# Patient Record
Sex: Male | Born: 1975 | Race: White | Hispanic: No | Marital: Married | Smoking: Never smoker
Health system: Southern US, Community
[De-identification: ages and names within clinical notes are randomized; demographics above are authoritative.]

## PROBLEM LIST (undated history)

## (undated) DIAGNOSIS — J45909 Unspecified asthma, uncomplicated: Secondary | ICD-10-CM

## (undated) DIAGNOSIS — R569 Unspecified convulsions: Secondary | ICD-10-CM

## (undated) DIAGNOSIS — F419 Anxiety disorder, unspecified: Secondary | ICD-10-CM

## (undated) DIAGNOSIS — E7800 Pure hypercholesterolemia, unspecified: Secondary | ICD-10-CM

## (undated) DIAGNOSIS — R12 Heartburn: Secondary | ICD-10-CM

## (undated) DIAGNOSIS — I10 Essential (primary) hypertension: Secondary | ICD-10-CM

## (undated) HISTORY — DX: Pure hypercholesterolemia, unspecified: E78.00

## (undated) HISTORY — PX: TYMPANOSTOMY TUBE PLACEMENT: SHX32

## (undated) HISTORY — DX: Essential (primary) hypertension: I10

## (undated) HISTORY — DX: Unspecified convulsions: R56.9

## (undated) HISTORY — DX: Anxiety disorder, unspecified: F41.9

## (undated) HISTORY — DX: Unspecified asthma, uncomplicated: J45.909

## (undated) HISTORY — DX: Heartburn: R12

---

## 2001-09-01 ENCOUNTER — Encounter: Payer: Self-pay | Admitting: Urology

## 2001-09-01 ENCOUNTER — Ambulatory Visit (HOSPITAL_COMMUNITY): Admission: RE | Admit: 2001-09-01 | Discharge: 2001-09-01 | Payer: Self-pay | Admitting: Urology

## 2007-10-19 ENCOUNTER — Ambulatory Visit (HOSPITAL_COMMUNITY): Admission: RE | Admit: 2007-10-19 | Discharge: 2007-10-19 | Payer: Self-pay | Admitting: Otolaryngology

## 2007-12-08 ENCOUNTER — Ambulatory Visit (HOSPITAL_COMMUNITY): Admission: RE | Admit: 2007-12-08 | Discharge: 2007-12-08 | Payer: Self-pay | Admitting: Otolaryngology

## 2008-07-02 ENCOUNTER — Ambulatory Visit: Payer: Self-pay | Admitting: Orthopedic Surgery

## 2008-07-02 DIAGNOSIS — S838X9A Sprain of other specified parts of unspecified knee, initial encounter: Secondary | ICD-10-CM | POA: Insufficient documentation

## 2008-07-02 DIAGNOSIS — S86819A Strain of other muscle(s) and tendon(s) at lower leg level, unspecified leg, initial encounter: Secondary | ICD-10-CM

## 2008-12-03 ENCOUNTER — Ambulatory Visit (HOSPITAL_COMMUNITY): Admission: RE | Admit: 2008-12-03 | Discharge: 2008-12-03 | Payer: Self-pay | Admitting: Family Medicine

## 2013-01-10 ENCOUNTER — Ambulatory Visit (INDEPENDENT_AMBULATORY_CARE_PROVIDER_SITE_OTHER): Payer: No Typology Code available for payment source | Admitting: Family Medicine

## 2013-01-10 ENCOUNTER — Encounter: Payer: Self-pay | Admitting: Family Medicine

## 2013-01-10 VITALS — BP 138/86 | Temp 98.5°F | Wt 272.8 lb

## 2013-01-10 DIAGNOSIS — E785 Hyperlipidemia, unspecified: Secondary | ICD-10-CM

## 2013-01-10 DIAGNOSIS — R079 Chest pain, unspecified: Secondary | ICD-10-CM

## 2013-01-10 NOTE — Patient Instructions (Signed)
Use ibuprofen 600 to 800 mg as neede for pain.

## 2013-01-10 NOTE — Progress Notes (Signed)
  Subjective:    Patient ID: Perry Huffman, male    DOB: 1976/06/28, 37 y.o.   MRN: [id number]  Abdominal Pain This is a recurrent problem. The onset quality is undetermined. The problem occurs intermittently. The most recent episode lasted 12 months. The problem has been unchanged. The pain is located in the LUQ. The pain is at a severity of 4/10. The pain is moderate. The quality of the pain is burning and aching. The abdominal pain radiates to the LLQ. Pertinent negatives include no anorexia or fever. The pain is aggravated by certain positions. He has tried nothing for the symptoms. There is no history of abdominal surgery, gallstones or GERD.   Chest pain. Sharp in nature. Sometimes worse with motions. Other times does not appear to change with motion. Can last for an hour. Generally not exercising these days. Positive family history of heart disease. No premature heart disease. Patient has personal history of hyperlipidemia. Admits to poor dietary, compliance.   Review of Systems  Constitutional: Negative for fever and fatigue.  Gastrointestinal: Positive for abdominal pain. Negative for anorexia.  Musculoskeletal: Positive for back pain.  All other systems reviewed and are negative.       Objective:   Physical Exam  Alert. Vitals reviewed. HEENT within normal limits. Lungs clear. Heart rare rhythm. No chest wall tenderness. Abdominal exam benign. No rebound no masses. Slight tenderness at costal margin.  EKG normal sinus rhythm no significant ST-T changes some artifact.    Assessment & Plan:  Impression chest pain highly doubt cardiac etiology discussed. #2 costal margin pain likely muscle skeletal discussed. #3 hyperlipidemia status uncertain. Plan patient to work hard on diet and check blood work in 2 months. Patient to call then. Ibuprofen when necessary for pain. No further workup at this time. WSL

## 2013-02-03 ENCOUNTER — Encounter: Payer: Self-pay | Admitting: Family Medicine

## 2013-09-06 ENCOUNTER — Other Ambulatory Visit (HOSPITAL_COMMUNITY): Payer: Self-pay | Admitting: Urology

## 2013-09-06 DIAGNOSIS — N200 Calculus of kidney: Secondary | ICD-10-CM

## 2013-09-12 ENCOUNTER — Ambulatory Visit (HOSPITAL_COMMUNITY)
Admission: RE | Admit: 2013-09-12 | Discharge: 2013-09-12 | Disposition: A | Discharge status: Home / Self Care | Payer: No Typology Code available for payment source | Source: Ambulatory Visit | Attending: Urology | Admitting: Urology

## 2013-09-12 DIAGNOSIS — N200 Calculus of kidney: Secondary | ICD-10-CM | POA: Insufficient documentation

## 2013-11-17 ENCOUNTER — Ambulatory Visit (INDEPENDENT_AMBULATORY_CARE_PROVIDER_SITE_OTHER): Payer: BC Managed Care – PPO | Admitting: Family Medicine

## 2013-11-17 ENCOUNTER — Encounter: Payer: Self-pay | Admitting: Family Medicine

## 2013-11-17 VITALS — BP 132/80 | Ht 70.0 in | Wt 261.2 lb

## 2013-11-17 DIAGNOSIS — M7710 Lateral epicondylitis, unspecified elbow: Secondary | ICD-10-CM

## 2013-11-17 NOTE — Patient Instructions (Addendum)
Forearm strap (velcro) for tennis elbow  Lateral Epicondylitis (Tennis Elbow) with Rehab Lateral epicondylitis involves inflammation and pain around the outer portion of the elbow. The pain is caused by inflammation of the tendons in the forearm that bring back (extend) the wrist. Lateral epicondylittis is also called tennis elbow, because it is very common in tennis players. However, it may occur in any individual who extends the wrist repetitively. If lateral epicondylitis is left untreated, it may become a chronic problem. SYMPTOMS   Pain, tenderness, and inflammation on the outer (lateral) side of the elbow.  Pain or weakness with gripping activities.  Pain that increases with wrist twisting motions (playing tennis, using a screwdriver, opening a door or a jar).  Pain with lifting objects, including a coffee cup. CAUSES  Lateral epicondylitis is caused by inflammation of the tendons that extend the wrist. Causes of injury may include:  Repetitive stress and strain on the muscles and tendons that extend the wrist.  Sudden change in activity level or intensity.  Incorrect grip in racquet sports.  Incorrect grip size of racquet (often too large).  Incorrect hitting position or technique (usually backhand, leading with the elbow).  Using a racket that is too heavy. RISK INCREASES WITH:  Sports or occupations that require repetitive and/or strenuous forearm and wrist movements (tennis, squash, racquetball, carpentry).  Poor wrist and forearm strength and flexibility.  Failure to warm up properly before activity.  Resuming activity before healing, rehabilitation, and conditioning are complete. PREVENTION   Warm up and stretch properly before activity.  Maintain physical fitness:  Strength, flexibility, and endurance.  Cardiovascular fitness.  Wear and use properly fitted equipment.  Learn and use proper technique and have a coach correct improper technique.  Wear a  tennis elbow (counterforce) brace. PROGNOSIS  The course of this condition depends on the degree of the injury. If treated properly, acute cases (symptoms lasting less than 4 weeks) are often resolved in 2 to 6 weeks. Chronic (longer lasting cases) often resolve in 3 to 6 months, but may require physical therapy. RELATED COMPLICATIONS   Frequently recurring symptoms, resulting in a chronic problem. Properly treating the problem the first time decreases frequency of recurrence.  Chronic inflammation, scarring tendon degeneration, and partial tendon tear, requiring surgery.  Delayed healing or resolution of symptoms. TREATMENT  Treatment first involves the use of ice and medicine, to reduce pain and inflammation. Strengthening and stretching exercises may help reduce discomfort, if performed regularly. These exercises may be performed at home, if the condition is an acute injury. Chronic cases may require a referral to a physical therapist for evaluation and treatment. Your caregiver may advise a corticosteroid injection, to help reduce inflammation. Rarely, surgery is needed. MEDICATION  If pain medicine is needed, nonsteroidal anti-inflammatory medicines (aspirin and ibuprofen), or other minor pain relievers (acetaminophen), are often advised.  Do not take pain medicine for 7 days before surgery.  Prescription pain relievers may be given, if your caregiver thinks they are needed. Use only as directed and only as much as you need.  Corticosteroid injections may be recommended. These injections should be reserved only for the most severe cases, because they can only be given a certain number of times. HEAT AND COLD  Cold treatment (icing) should be applied for 10 to 15 minutes every 2 to 3 hours for inflammation and pain, and immediately after activity that aggravates your symptoms. Use ice packs or an ice massage.  Heat treatment may be used before performing  stretching and strengthening  activities prescribed by your caregiver, physical therapist, or athletic trainer. Use a heat pack or a warm water soak. SEEK MEDICAL CARE IF: Symptoms get worse or do not improve in 2 weeks, despite treatment. EXERCISES  RANGE OF MOTION (ROM) AND STRETCHING EXERCISES - Epicondylitis, Lateral (Tennis Elbow) These exercises may help you when beginning to rehabilitate your injury. Your symptoms may go away with or without further involvement from your physician, physical therapist or athletic trainer. While completing these exercises, remember:   Restoring tissue flexibility helps normal motion to return to the joints. This allows healthier, less painful movement and activity.  An effective stretch should be held for at least 30 seconds.  A stretch should never be painful. You should only feel a gentle lengthening or release in the stretched tissue. RANGE OF MOTION  Wrist Flexion, Active-Assisted  Extend your right / left elbow with your fingers pointing down.*  Gently pull the back of your hand towards you, until you feel a gentle stretch on the top of your forearm.  Hold this position for __________ seconds. Repeat __________ times. Complete this exercise __________ times per day.  *If directed by your physician, physical therapist or athletic trainer, complete this stretch with your elbow bent, rather than extended. RANGE OF MOTION  Wrist Extension, Active-Assisted  Extend your right / left elbow and turn your palm upwards.*  Gently pull your palm and fingertips back, so your wrist extends and your fingers point more toward the ground.  You should feel a gentle stretch on the inside of your forearm.  Hold this position for __________ seconds. Repeat __________ times. Complete this exercise __________ times per day. *If directed by your physician, physical therapist or athletic trainer, complete this stretch with your elbow bent, rather than extended. STRETCH - Wrist Flexion  Place  the back of your right / left hand on a tabletop, leaving your elbow slightly bent. Your fingers should point away from your body.  Gently press the back of your hand down onto the table by straightening your elbow. You should feel a stretch on the top of your forearm.  Hold this position for __________ seconds. Repeat __________ times. Complete this stretch __________ times per day.  STRETCH  Wrist Extension   Place your right / left fingertips on a tabletop, leaving your elbow slightly bent. Your fingers should point backwards.  Gently press your fingers and palm down onto the table by straightening your elbow. You should feel a stretch on the inside of your forearm.  Hold this position for __________ seconds. Repeat __________ times. Complete this stretch __________ times per day.  STRENGTHENING EXERCISES - Epicondylitis, Lateral (Tennis Elbow) These exercises may help you when beginning to rehabilitate your injury. They may resolve your symptoms with or without further involvement from your physician, physical therapist or athletic trainer. While completing these exercises, remember:   Muscles can gain both the endurance and the strength needed for everyday activities through controlled exercises.  Complete these exercises as instructed by your physician, physical therapist or athletic trainer. Increase the resistance and repetitions only as guided.  You may experience muscle soreness or fatigue, but the pain or discomfort you are trying to eliminate should never worsen during these exercises. If this pain does get worse, stop and make sure you are following the directions exactly. If the pain is still present after adjustments, discontinue the exercise until you can discuss the trouble with your caregiver. STRENGTH Wrist Flexors  Sit with  your right / left forearm palm-up and fully supported on a table or countertop. Your elbow should be resting below the height of your shoulder. Allow  your wrist to extend over the edge of the surface.  Loosely holding a __________ weight, or a piece of rubber exercise band or tubing, slowly curl your hand up toward your forearm.  Hold this position for __________ seconds. Slowly lower the wrist back to the starting position in a controlled manner. Repeat __________ times. Complete this exercise __________ times per day.  STRENGTH  Wrist Extensors  Sit with your right / left forearm palm-down and fully supported on a table or countertop. Your elbow should be resting below the height of your shoulder. Allow your wrist to extend over the edge of the surface.  Loosely holding a __________ weight, or a piece of rubber exercise band or tubing, slowly curl your hand up toward your forearm.  Hold this position for __________ seconds. Slowly lower the wrist back to the starting position in a controlled manner. Repeat __________ times. Complete this exercise __________ times per day.  STRENGTH - Ulnar Deviators  Stand with a ____________________ weight in your right / left hand, or sit while holding a rubber exercise band or tubing, with your healthy arm supported on a table or countertop.  Move your wrist, so that your pinkie travels toward your forearm and your thumb moves away from your forearm.  Hold this position for __________ seconds and then slowly lower the wrist back to the starting position. Repeat __________ times. Complete this exercise __________ times per day STRENGTH - Radial Deviators  Stand with a ____________________ weight in your right / left hand, or sit while holding a rubber exercise band or tubing, with your injured arm supported on a table or countertop.  Raise your hand upward in front of you or pull up on the rubber tubing.  Hold this position for __________ seconds and then slowly lower the wrist back to the starting position. Repeat __________ times. Complete this exercise __________ times per day. STRENGTH   Forearm Supinators   Sit with your right / left forearm supported on a table, keeping your elbow below shoulder height. Rest your hand over the edge, palm down.  Gently grip a hammer or a soup ladle.  Without moving your elbow, slowly turn your palm and hand upward to a "thumbs-up" position.  Hold this position for __________ seconds. Slowly return to the starting position. Repeat __________ times. Complete this exercise __________ times per day.  STRENGTH  Forearm Pronators   Sit with your right / left forearm supported on a table, keeping your elbow below shoulder height. Rest your hand over the edge, palm up.  Gently grip a hammer or a soup ladle.  Without moving your elbow, slowly turn your palm and hand upward to a "thumbs-up" position.  Hold this position for __________ seconds. Slowly return to the starting position. Repeat __________ times. Complete this exercise __________ times per day.  STRENGTH - Grip  Grasp a tennis ball, a dense sponge, or a large, rolled sock in your hand.  Squeeze as hard as you can, without increasing any pain.  Hold this position for __________ seconds. Release your grip slowly. Repeat __________ times. Complete this exercise __________ times per day.  STRENGTH - Elbow Extensors, Isometric  Stand or sit upright, on a firm surface. Place your right / left arm so that your palm faces your stomach, and it is at the height of your  waist.  Place your opposite hand on the underside of your forearm. Gently push up as your right / left arm resists. Push as hard as you can with both arms, without causing any pain or movement at your right / left elbow. Hold this stationary position for __________ seconds. Gradually release the tension in both arms. Allow your muscles to relax completely before repeating. Document Released: 09/28/2005 Document Revised: 12/21/2011 Document Reviewed: 01/10/2009 [facility] Patient Information 2014 [employer], [place].

## 2013-11-17 NOTE — Progress Notes (Signed)
   Subjective:    Patient ID: Perry Huffman, male    DOB: 10/24/1975, 38 y.o.   MRN: [id number]  HPI Patient is here today for right elbow pain. He wants to start doing cross fit with his wife and wants a professional opinion before he starts to do this. It has been present for about 2 years now. No numbness or tingling noted.   Popping sensation in elboe  Had hit on piece of steel couple weeks ago  Can now do stuff with the elbow, aching at times  Elbow pain a nuisance now,  Not otc meds  Comes and goes, gets worse on tough days    Review of Systems No pain elsewhere no shoulder pain no chest pain no shortness of breath ROS otherwise negative    Objective:   Physical Exam  Alert no apparent distress. Lungs clear. Heart regular in rhythm. Right posterior old friend and prominent but no palpable bursa some lateral epicondyle tenderness. Elbow good range of motion to      Assessment & Plan:  Impression 1 lateral epicondylitis discussed #2 Lotrimin prominence reassured plan form strap. Aleve twice a day when necessary during flare. May proceed with exercise. WSL

## 2013-12-01 ENCOUNTER — Ambulatory Visit (INDEPENDENT_AMBULATORY_CARE_PROVIDER_SITE_OTHER): Payer: BC Managed Care – PPO | Admitting: Family Medicine

## 2013-12-01 ENCOUNTER — Encounter: Payer: Self-pay | Admitting: Family Medicine

## 2013-12-01 VITALS — BP 134/80 | Ht 70.0 in | Wt 259.0 lb

## 2013-12-01 DIAGNOSIS — F411 Generalized anxiety disorder: Secondary | ICD-10-CM

## 2013-12-01 DIAGNOSIS — Z79899 Other long term (current) drug therapy: Secondary | ICD-10-CM

## 2013-12-01 DIAGNOSIS — E785 Hyperlipidemia, unspecified: Secondary | ICD-10-CM

## 2013-12-01 DIAGNOSIS — R079 Chest pain, unspecified: Secondary | ICD-10-CM

## 2013-12-01 MED ORDER — ALPRAZOLAM 0.5 MG PO TBDP: 0.5000 mg | ORAL_TABLET | Freq: Every day | ORAL | Status: DC

## 2013-12-01 MED ORDER — PRAVASTATIN SODIUM 20 MG PO TABS: 20.0000 mg | ORAL_TABLET | Freq: Every day | ORAL | Status: DC

## 2013-12-01 MED ORDER — ALPRAZOLAM 0.5 MG PO TABS: 0.5000 mg | ORAL_TABLET | Freq: Every day | ORAL | Status: DC | PRN

## 2013-12-01 NOTE — Progress Notes (Signed)
   Subjective:    Patient ID: Perry MccallumCraig S Huffman, male    DOB: 02/25/1976, 38 y.o.   MRN: [id number]  HPIChest pain off and on for awhile. Chest pain on the right side.comes and gos. Sharp at times, burning other times. Pain often comes with stress. Exercise chest does not hurt.  No meds in the past for anxiety.some on maternal side    Increased stress lately  Panic type attacks, heart pounds and short of breath. 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 But he does feel he needs something for nerves.   Pt thinks its from stress and anxiety.   Discuss cholesterol med. Pt has not had any recent bloodwork.  Recent blood work showed very significant elevation. See prior notes. LDL was quite high.   Review of Systems No back pain no headache some fatigue no abdominal pain no change in bowel habits no blood in stool no rash ROS otherwise negative    Objective:   Physical Exam Alert no apparent distress. HEENT normal. Lungs clear. Heart regular in rhythm. Abdomen benign.  EKG within normal limits.     Assessment & Plan:  Impression 1 chest pain highly doubt cardiac etiology discussed at length. Likely related to #2. #2 anxiety with stress. #3 hyperlipidemia discussed length time for medication. Plan diet exercise discussed in encourage. Educational information given. Initiate Pravachol. 20 mg each bedtime. Appropriate blood work. Xanax 0.5 mg when necessary for anxiety or stress. Warning signs discussed. 35-40 minutes spent with patient most in discussion. WSL

## 2013-12-02 LAB — HEPATIC FUNCTION PANEL
ALT: 38 U/L (ref 0–53)
AST: 23 U/L (ref 0–37)
Albumin: 4.4 g/dL (ref 3.5–5.2)
Alkaline Phosphatase: 64 U/L (ref 39–117)
Bilirubin, Direct: 0.2 mg/dL (ref 0.0–0.3)
Indirect Bilirubin: 0.9 mg/dL (ref 0.2–1.2)
TOTAL PROTEIN: 6.8 g/dL (ref 6.0–8.3)
Total Bilirubin: 1.1 mg/dL (ref 0.2–1.2)

## 2013-12-02 LAB — BASIC METABOLIC PANEL
BUN: 18 mg/dL (ref 6–23)
CO2: 27 mEq/L (ref 19–32)
Calcium: 9.2 mg/dL (ref 8.4–10.5)
Chloride: 102 mEq/L (ref 96–112)
Creat: 1.03 mg/dL (ref 0.50–1.35)
Glucose, Bld: 88 mg/dL (ref 70–99)
POTASSIUM: 4.1 meq/L (ref 3.5–5.3)
SODIUM: 139 meq/L (ref 135–145)

## 2013-12-02 LAB — LIPID PANEL
CHOL/HDL RATIO: 4.9 ratio
CHOLESTEROL: 212 mg/dL — AB (ref 0–200)
HDL: 43 mg/dL (ref >39)
LDL Cholesterol: 143 mg/dL — ABNORMAL HIGH (ref 0–99)
TRIGLYCERIDES: 129 mg/dL (ref <150)
VLDL: 26 mg/dL (ref 0–40)

## 2013-12-03 DIAGNOSIS — F411 Generalized anxiety disorder: Secondary | ICD-10-CM | POA: Insufficient documentation

## 2013-12-03 DIAGNOSIS — R079 Chest pain, unspecified: Secondary | ICD-10-CM | POA: Insufficient documentation

## 2013-12-25 ENCOUNTER — Encounter: Payer: Self-pay | Admitting: Family Medicine

## 2013-12-27 ENCOUNTER — Other Ambulatory Visit: Payer: Self-pay | Admitting: Family Medicine

## 2014-03-15 ENCOUNTER — Encounter: Payer: Self-pay | Admitting: Family Medicine

## 2014-03-15 ENCOUNTER — Ambulatory Visit (INDEPENDENT_AMBULATORY_CARE_PROVIDER_SITE_OTHER): Payer: BC Managed Care – PPO | Admitting: Family Medicine

## 2014-03-15 VITALS — BP 138/86 | Ht 70.0 in | Wt 238.2 lb

## 2014-03-15 DIAGNOSIS — E782 Mixed hyperlipidemia: Secondary | ICD-10-CM

## 2014-03-15 DIAGNOSIS — Z79899 Other long term (current) drug therapy: Secondary | ICD-10-CM

## 2014-03-15 NOTE — Progress Notes (Signed)
   Subjective:    Patient ID: Perry Huffman, male    DOB: 02-28-1976, 38 y.o.   MRN: [id number]  Hyperlipidemia This is a chronic problem. The current episode started more than 1 month ago. Current antihyperlipidemic treatment includes statins. There are no compliance problems.    Patient also states he was told at work his ears were impacted with wax and would like you to check and see if this is right., ques if waxy now   crosss training , two three times per wk, has lost 27 pounds  Tries not to miss med, misses one or two per seven,  Pretty good diet since, fam hx of ele v chol  Uses xanax not at all  Review of Systems No headache no chest pain no back pain no change in bowel habits no blood in stool ROS otherwise negative    Objective:   Physical Exam Alert no apparent distress. Lungs clear. Heart regular in rhythm. H&T normal. Blood pressure good on repeat. TMs a lot of wax       Assessment & Plan:  Impression 1 hyperlipidemia exact control uncertain. Has work hard on diet and exercise #2 tympanic membrane impaction #3 anxiety stable has not used Xanax for quite some time plan appropriate blood work. If good will maintain same. Diet exercise discussed check every 6 months. Encouraged to come back in and see a nurse 4 year assessment. WSL

## 2014-03-23 LAB — HEPATIC FUNCTION PANEL
ALT: 23 U/L (ref 0–53)
AST: 20 U/L (ref 0–37)
Albumin: 4.3 g/dL (ref 3.5–5.2)
Alkaline Phosphatase: 65 U/L (ref 39–117)
Bilirubin, Direct: 0.2 mg/dL (ref 0.0–0.3)
Indirect Bilirubin: 0.8 mg/dL (ref 0.2–1.2)
TOTAL PROTEIN: 6.5 g/dL (ref 6.0–8.3)
Total Bilirubin: 1 mg/dL (ref 0.2–1.2)

## 2014-03-23 LAB — LIPID PANEL
CHOL/HDL RATIO: 3.6 ratio
CHOLESTEROL: 146 mg/dL (ref 0–200)
HDL: 41 mg/dL (ref >39)
LDL Cholesterol: 90 mg/dL (ref 0–99)
TRIGLYCERIDES: 76 mg/dL (ref <150)
VLDL: 15 mg/dL (ref 0–40)

## 2014-04-11 ENCOUNTER — Ambulatory Visit (INDEPENDENT_AMBULATORY_CARE_PROVIDER_SITE_OTHER): Payer: BC Managed Care – PPO

## 2014-04-11 DIAGNOSIS — H6120 Impacted cerumen, unspecified ear: Secondary | ICD-10-CM

## 2014-04-11 DIAGNOSIS — H6123 Impacted cerumen, bilateral: Secondary | ICD-10-CM

## 2014-04-11 NOTE — Progress Notes (Signed)
Bilateral ear irrigation completed without difficulty. Patient tolerated well.  

## 2014-09-03 ENCOUNTER — Encounter: Payer: Self-pay | Admitting: Family Medicine

## 2014-09-03 ENCOUNTER — Ambulatory Visit (INDEPENDENT_AMBULATORY_CARE_PROVIDER_SITE_OTHER): Payer: BC Managed Care – PPO | Admitting: Family Medicine

## 2014-09-03 VITALS — BP 124/80 | Temp 98.4°F | Ht 70.0 in | Wt 234.0 lb

## 2014-09-03 DIAGNOSIS — H6501 Acute serous otitis media, right ear: Secondary | ICD-10-CM

## 2014-09-03 MED ORDER — AMOXICILLIN 500 MG PO CAPS: 500.0000 mg | ORAL_CAPSULE | Freq: Three times a day (TID) | ORAL | Status: DC

## 2014-09-03 NOTE — Progress Notes (Signed)
   Subjective:    Patient ID: Perry Huffman, male    DOB: 06/18/1976, 38 y.o.   MRN: [id number]  HPI Patient is here today because he had some plastic pellets fall into his ears at work. He thinks he removed all of them but he wants us to flush his ears to remove any he may have missed. This happened last Thursday. Patient states he has no other concerns at this time.   Patient recently has had some congestion drainage and stuffiness. Right ear feels the most full.  History of wax in ears and past and needed removal Review of Systems    no vomiting no diarrhea no headache Objective:   Physical Exam  Alert vitals stable no acute distress right tympanic membrane is somewhat dull external canal fairly clean left canal mostly obscured by wax TMs normal pharynx normal lungs clear heart regular in rhythm.   Left ear irrigated and right ear      Assessment & Plan:  Impression serous otitis media with cerumen impaction partial plan antibiotics prescribed. He rinsed out. No foreign bodies discussed. WSL

## 2014-09-12 ENCOUNTER — Other Ambulatory Visit: Payer: Self-pay | Admitting: Family Medicine

## 2014-12-03 ENCOUNTER — Telehealth: Payer: Self-pay | Admitting: Family Medicine

## 2014-12-03 DIAGNOSIS — E785 Hyperlipidemia, unspecified: Secondary | ICD-10-CM

## 2014-12-03 DIAGNOSIS — Z79899 Other long term (current) drug therapy: Secondary | ICD-10-CM

## 2014-12-03 NOTE — Telephone Encounter (Signed)
Pt is requesting lab orders to be sent over. His appt is scheduled for 2/29. Last labs were: lipid and hepatic panel 03/23/14

## 2014-12-03 NOTE — Telephone Encounter (Signed)
Lip liv m7 

## 2014-12-03 NOTE — Telephone Encounter (Signed)
Blood work has been ordered. Patient was notified  

## 2014-12-07 LAB — BASIC METABOLIC PANEL
BUN: 17 mg/dL (ref 6–23)
CALCIUM: 9.2 mg/dL (ref 8.4–10.5)
CO2: 28 mEq/L (ref 19–32)
Chloride: 102 mEq/L (ref 96–112)
Creat: 0.97 mg/dL (ref 0.50–1.35)
Glucose, Bld: 84 mg/dL (ref 70–99)
Potassium: 4.3 mEq/L (ref 3.5–5.3)
Sodium: 138 mEq/L (ref 135–145)

## 2014-12-07 LAB — LIPID PANEL
CHOLESTEROL: 218 mg/dL — AB (ref 0–200)
HDL: 51 mg/dL (ref >=40)
LDL Cholesterol: 137 mg/dL — ABNORMAL HIGH (ref 0–99)
TRIGLYCERIDES: 152 mg/dL — AB (ref <150)
Total CHOL/HDL Ratio: 4.3 Ratio
VLDL: 30 mg/dL (ref 0–40)

## 2014-12-07 LAB — HEPATIC FUNCTION PANEL
ALT: 16 U/L (ref 0–53)
AST: 15 U/L (ref 0–37)
Albumin: 4.4 g/dL (ref 3.5–5.2)
Alkaline Phosphatase: 70 U/L (ref 39–117)
BILIRUBIN INDIRECT: 0.8 mg/dL (ref 0.2–1.2)
Bilirubin, Direct: 0.2 mg/dL (ref 0.0–0.3)
TOTAL PROTEIN: 6.9 g/dL (ref 6.0–8.3)
Total Bilirubin: 1 mg/dL (ref 0.2–1.2)

## 2014-12-10 ENCOUNTER — Encounter: Payer: Self-pay | Admitting: Family Medicine

## 2014-12-10 ENCOUNTER — Ambulatory Visit (INDEPENDENT_AMBULATORY_CARE_PROVIDER_SITE_OTHER): Payer: BLUE CROSS/BLUE SHIELD | Admitting: Family Medicine

## 2014-12-10 VITALS — BP 130/86 | Ht 70.0 in | Wt 243.8 lb

## 2014-12-10 DIAGNOSIS — E785 Hyperlipidemia, unspecified: Secondary | ICD-10-CM

## 2014-12-10 MED ORDER — ALPRAZOLAM 0.5 MG PO TABS: 0.5000 mg | ORAL_TABLET | Freq: Every day | ORAL | Status: DC | PRN

## 2014-12-10 MED ORDER — PRAVASTATIN SODIUM 20 MG PO TABS: 20.0000 mg | ORAL_TABLET | Freq: Every day | ORAL | Status: DC

## 2014-12-10 NOTE — Progress Notes (Signed)
   Subjective:    Patient ID: Perry MccallumCraig S Bratz, male    DOB: 07/26/1976, 39 y.o.   MRN: [id number]  Hyperlipidemia This is a chronic problem. The current episode started more than 1 year ago. Treatments tried: pravachol-been out 2 months.    Results for orders placed or performed in visit on 12/03/14  Lipid panel  Result Value Ref Range   Cholesterol 218 (H) 0 - 200 mg/dL   Triglycerides 782152 (H) <150 mg/dL   HDL 51 >=95>=40 mg/dL   Total CHOL/HDL Ratio 4.3 Ratio   VLDL 30 0 - 40 mg/dL   LDL Cholesterol 621137 (H) 0 - 99 mg/dL  Hepatic function panel  Result Value Ref Range   Total Bilirubin 1.0 0.2 - 1.2 mg/dL   Bilirubin, Direct 0.2 0.0 - 0.3 mg/dL   Indirect Bilirubin 0.8 0.2 - 1.2 mg/dL   Alkaline Phosphatase 70 39 - 117 U/L   AST 15 0 - 37 U/L   ALT 16 0 - 53 U/L   Total Protein 6.9 6.0 - 8.3 g/dL   Albumin 4.4 3.5 - 5.2 g/dL  Basic metabolic panel  Result Value Ref Range   Sodium 138 135 - 145 mEq/L   Potassium 4.3 3.5 - 5.3 mEq/L   Chloride 102 96 - 112 mEq/L   CO2 28 19 - 32 mEq/L   Glucose, Bld 84 70 - 99 mg/dL   BUN 17 6 - 23 mg/dL   Creat 3.080.97 6.570.50 - 8.461.35 mg/dL   Calcium 9.2 8.4 - 96.210.5 mg/dL   Exercising regularly    Review of Systems No headache no chest pain no back pain no abdominal pain no change in bowel habits    Objective:   Physical Exam  Alert no acute distress vital stable lungs clear heart rare rhythm H&T normal      Assessment & Plan:  Impression hyperlipidemia suboptimal in control with noncompliance discussed #2 history of insomnia and anxiety request refill on Xanax plan medications refilled. Diet exercise discussed. Check every 6 months. WSL

## 2015-06-20 ENCOUNTER — Other Ambulatory Visit: Payer: Self-pay | Admitting: Family Medicine

## 2015-07-08 ENCOUNTER — Telehealth: Payer: Self-pay | Admitting: Family Medicine

## 2015-07-08 DIAGNOSIS — E785 Hyperlipidemia, unspecified: Secondary | ICD-10-CM

## 2015-07-08 DIAGNOSIS — Z79899 Other long term (current) drug therapy: Secondary | ICD-10-CM

## 2015-07-08 NOTE — Telephone Encounter (Signed)
Pt is requesting lab orders to be sent over for his appt next tues. Last labs per epic were: lipid,hepatic and bmp on 12/06/14

## 2015-07-08 NOTE — Telephone Encounter (Signed)
Lip liv 

## 2015-07-09 NOTE — Telephone Encounter (Signed)
Blood work ordered in EPIC. Patient notified. 

## 2015-07-11 LAB — LIPID PANEL
CHOL/HDL RATIO: 4 ratio (ref 0.0–5.0)
CHOLESTEROL TOTAL: 189 mg/dL (ref 100–199)
HDL: 47 mg/dL (ref >39)
LDL CALC: 112 mg/dL — AB (ref 0–99)
Triglycerides: 148 mg/dL (ref 0–149)
VLDL Cholesterol Cal: 30 mg/dL (ref 5–40)

## 2015-07-11 LAB — HEPATIC FUNCTION PANEL
ALT: 21 IU/L (ref 0–44)
AST: 17 IU/L (ref 0–40)
Albumin: 4.1 g/dL (ref 3.5–5.5)
Alkaline Phosphatase: 73 IU/L (ref 39–117)
Bilirubin Total: 0.5 mg/dL (ref 0.0–1.2)
Bilirubin, Direct: 0.15 mg/dL (ref 0.00–0.40)
Total Protein: 6.5 g/dL (ref 6.0–8.5)

## 2015-07-16 ENCOUNTER — Ambulatory Visit (INDEPENDENT_AMBULATORY_CARE_PROVIDER_SITE_OTHER): Payer: BLUE CROSS/BLUE SHIELD | Admitting: Family Medicine

## 2015-07-16 ENCOUNTER — Encounter: Payer: Self-pay | Admitting: Family Medicine

## 2015-07-16 VITALS — BP 140/86 | Ht 70.0 in | Wt 257.5 lb

## 2015-07-16 DIAGNOSIS — Z23 Encounter for immunization: Secondary | ICD-10-CM

## 2015-07-16 DIAGNOSIS — J392 Other diseases of pharynx: Secondary | ICD-10-CM | POA: Diagnosis not present

## 2015-07-16 DIAGNOSIS — R0982 Postnasal drip: Secondary | ICD-10-CM | POA: Diagnosis not present

## 2015-07-16 DIAGNOSIS — E785 Hyperlipidemia, unspecified: Secondary | ICD-10-CM | POA: Diagnosis not present

## 2015-07-16 MED ORDER — PRAVASTATIN SODIUM 20 MG PO TABS: 20.0000 mg | ORAL_TABLET | Freq: Every day | ORAL | Status: DC

## 2015-07-16 NOTE — Patient Instructions (Signed)
For the occasional tickle you may want to try otc claritin (loratadine) 10 mg daily for a couple months as a trial, should help

## 2015-07-16 NOTE — Progress Notes (Signed)
   Subjective:    Patient ID: Perry Huffman, male    DOB: 02/28/1976, 39 y.o.   MRN: [id number]  Hyperlipidemia This is a chronic problem. The current episode started more than 1 year ago. There are no known factors aggravating his hyperlipidemia. Current antihyperlipidemic treatment includes statins. The current treatment provides moderate improvement of lipids. There are no compliance problems.  There are no known risk factors for coronary artery disease.   Patient states that he has a couple of questions for the doctor. Nothing major he states.     supp otc well controlled  Results for orders placed or performed in visit on 07/08/15  Lipid panel  Result Value Ref Range   Cholesterol, Total 189 100 - 199 mg/dL   Triglycerides 409 0 - 149 mg/dL   HDL 47 >81 mg/dL   VLDL Cholesterol Cal 30 5 - 40 mg/dL   LDL Calculated 191 (H) 0 - 99 mg/dL   Chol/HDL Ratio 4.0 0.0 - 5.0 ratio units  Hepatic function panel  Result Value Ref Range   Total Protein 6.5 6.0 - 8.5 g/dL   Albumin 4.1 3.5 - 5.5 g/dL   Bilirubin Total 0.5 0.0 - 1.2 mg/dL   Bilirubin, Direct 4.78 0.00 - 0.40 mg/dL   Alkaline Phosphatase 73 39 - 117 IU/L   AST 17 0 - 40 IU/L   ALT 21 0 - 44 IU/L   Diet slacked off  Notes tickle in the throat the past few months this is often accompanied by slight irritation in the throat. Can occur sporadically.  Patient is very worried about this. States that he worries about cancer a lot. Also worries or maybe some blockage that will require esophagus stretched. No major history of reflux. No true food dysphagia some slight nasal congestion. No throat pain no headache no weight loss no abdominal pain no back pain  Flu shot given  Review of Systems    see above Objective:   Physical Exam Alert vitals stable. Blood pressure good on repeat HEENT neck supple pharynx normal TMs normal no adenopathy lungs clear. Heart regular in rhythm. Abdomen nontender.       Assessment & Plan:    Impression hyperlipidemia discuss appropriate control #2 cancer concerns discussed nonspecific throat tickling in absence of any other features on history or exam do not lend to any substantial possibility of cancer. Discussed at great length. At least 10 questions asked and answered. 25 minutes spent most in discussion. Plan trial of Claritin. Maintain lipids. Diet exercise discussed. Flu shot today recheck in 6 months

## 2016-02-21 ENCOUNTER — Other Ambulatory Visit: Payer: Self-pay | Admitting: Family Medicine

## 2016-03-19 ENCOUNTER — Telehealth: Payer: Self-pay | Admitting: Family Medicine

## 2016-03-19 DIAGNOSIS — Z79899 Other long term (current) drug therapy: Secondary | ICD-10-CM

## 2016-03-19 DIAGNOSIS — E785 Hyperlipidemia, unspecified: Secondary | ICD-10-CM

## 2016-03-19 NOTE — Telephone Encounter (Signed)
Lip liv m7 

## 2016-03-19 NOTE — Telephone Encounter (Signed)
Requesting order for blood work. °

## 2016-03-19 NOTE — Telephone Encounter (Signed)
He has an appointment next Thursday, and is hoping to have his labs completed tomorrow.

## 2016-03-20 NOTE — Addendum Note (Signed)
Addended by: Dereck LigasJOHNSON, Tyrin Herbers P on: 03/20/2016 08:28 AM   Modules accepted: Orders

## 2016-03-20 NOTE — Telephone Encounter (Signed)
Blood work in system. Patient is at [facility]

## 2016-03-21 LAB — BASIC METABOLIC PANEL
BUN/Creatinine Ratio: 15 (ref 9–20)
BUN: 15 mg/dL (ref 6–20)
CHLORIDE: 103 mmol/L (ref 96–106)
CO2: 23 mmol/L (ref 18–29)
Calcium: 9.5 mg/dL (ref 8.7–10.2)
Creatinine, Ser: 1.03 mg/dL (ref 0.76–1.27)
GFR calc non Af Amer: 91 mL/min/{1.73_m2} (ref >59)
GFR, EST AFRICAN AMERICAN: 105 mL/min/{1.73_m2} (ref >59)
Glucose: 95 mg/dL (ref 65–99)
POTASSIUM: 4.4 mmol/L (ref 3.5–5.2)
Sodium: 141 mmol/L (ref 134–144)

## 2016-03-21 LAB — HEPATIC FUNCTION PANEL
ALBUMIN: 4.2 g/dL (ref 3.5–5.5)
ALT: 40 IU/L (ref 0–44)
AST: 23 IU/L (ref 0–40)
Alkaline Phosphatase: 80 IU/L (ref 39–117)
BILIRUBIN TOTAL: 0.7 mg/dL (ref 0.0–1.2)
Bilirubin, Direct: 0.16 mg/dL (ref 0.00–0.40)
Total Protein: 7 g/dL (ref 6.0–8.5)

## 2016-03-21 LAB — LIPID PANEL
CHOL/HDL RATIO: 4.1 ratio (ref 0.0–5.0)
Cholesterol, Total: 196 mg/dL (ref 100–199)
HDL: 48 mg/dL (ref >39)
LDL CALC: 127 mg/dL — AB (ref 0–99)
Triglycerides: 103 mg/dL (ref 0–149)
VLDL CHOLESTEROL CAL: 21 mg/dL (ref 5–40)

## 2016-03-26 ENCOUNTER — Encounter: Payer: Self-pay | Admitting: Family Medicine

## 2016-03-26 ENCOUNTER — Ambulatory Visit (INDEPENDENT_AMBULATORY_CARE_PROVIDER_SITE_OTHER): Payer: BLUE CROSS/BLUE SHIELD | Admitting: Family Medicine

## 2016-03-26 VITALS — BP 130/82 | Ht 70.0 in | Wt 275.0 lb

## 2016-03-26 DIAGNOSIS — E785 Hyperlipidemia, unspecified: Secondary | ICD-10-CM

## 2016-03-26 MED ORDER — PRAVASTATIN SODIUM 20 MG PO TABS: 20.0000 mg | ORAL_TABLET | Freq: Every day | ORAL | Status: DC

## 2016-03-26 NOTE — Progress Notes (Signed)
   Subjective:    Patient ID: Perry MccallumCraig S Fitton, male    DOB: 02/24/1976, 40 y.o.   MRN: [id number]  Hyperlipidemia This is a chronic problem. The current episode started more than 1 year ago. There are no known factors aggravating his hyperlipidemia. Current antihyperlipidemic treatment includes statins. The current treatment provides moderate improvement of lipids. There are no compliance problems.  There are no known risk factors for coronary artery disease.   Patient has no concerns at this time.  Results for orders placed or performed in visit on 03/19/16  Lipid panel  Result Value Ref Range   Cholesterol, Total 196 100 - 199 mg/dL   Triglycerides 132103 0 - 149 mg/dL   HDL 48 >44>39 mg/dL   VLDL Cholesterol Cal 21 5 - 40 mg/dL   LDL Calculated 010127 (H) 0 - 99 mg/dL   Chol/HDL Ratio 4.1 0.0 - 5.0 ratio units  Hepatic function panel  Result Value Ref Range   Total Protein 7.0 6.0 - 8.5 g/dL   Albumin 4.2 3.5 - 5.5 g/dL   Bilirubin Total 0.7 0.0 - 1.2 mg/dL   Bilirubin, Direct 2.720.16 0.00 - 0.40 mg/dL   Alkaline Phosphatase 80 39 - 117 IU/L   AST 23 0 - 40 IU/L   ALT 40 0 - 44 IU/L  Basic metabolic panel  Result Value Ref Range   Glucose 95 65 - 99 mg/dL   BUN 15 6 - 20 mg/dL   Creatinine, Ser 5.361.03 0.76 - 1.27 mg/dL   GFR calc non Af Amer 91 >59 mL/min/1.73   GFR calc Af Amer 105 >59 mL/min/1.73   BUN/Creatinine Ratio 15 9 - 20   Sodium 141 134 - 144 mmol/L   Potassium 4.4 3.5 - 5.2 mmol/L   Chloride 103 96 - 106 mmol/L   CO2 23 18 - 29 mmol/L   Calcium 9.5 8.7 - 10.2 mg/dL   Physically active with jb bon set  Doing crossfit, had slacke d off       Review of Systems No headache, no major weight loss or weight gain, no chest pain no back pain abdominal pain no change in bowel habits complete ROS otherwise negative     Objective:   Physical Exam Alert vital stable blood pressure improved on repeat HEENT normal lungs clear. Heart regular in rhythm.       Assessment & Plan:    Impression hyperlipidemia good control discussed #2 physical inactivity discussed plan need to increase exercises will help with weight maintain same medications recheck in 6 months diet exercise discussed WSL

## 2016-03-30 ENCOUNTER — Other Ambulatory Visit: Payer: Self-pay | Admitting: Family Medicine

## 2016-10-23 ENCOUNTER — Ambulatory Visit (INDEPENDENT_AMBULATORY_CARE_PROVIDER_SITE_OTHER): Payer: BLUE CROSS/BLUE SHIELD | Admitting: Nurse Practitioner

## 2016-10-23 ENCOUNTER — Encounter: Payer: Self-pay | Admitting: Nurse Practitioner

## 2016-10-23 VITALS — BP 132/84 | Temp 98.3°F | Wt 283.6 lb

## 2016-10-23 DIAGNOSIS — J069 Acute upper respiratory infection, unspecified: Secondary | ICD-10-CM | POA: Diagnosis not present

## 2016-10-23 MED ORDER — AZITHROMYCIN 250 MG PO TABS: ORAL_TABLET | ORAL | 0 refills | Status: DC

## 2016-10-23 NOTE — Progress Notes (Signed)
Subjective:  Presents for complaints of sore throat and cough that began 3 days ago. No fever. Sore throat intense last night. No headache or ear pain. Occasional congested cough. Slight wheezing. Minimal chest pain with cough. No shortness of breath.  Objective:   BP 132/84   Temp 98.3 F (36.8 C) (Oral)   Wt 283 lb 9.6 oz (128.6 kg)   BMI 40.69 kg/m  NAD. Alert, oriented. TMs retracted, no erythema. Right TM partially obscured with cerumen. Pharynx mild erythema with 1 tiny white patch on the left tonsil. PND noted. Neck supple with mild soft anterior adenopathy. Lungs clear. Heart regular rate rhythm.  Assessment:  Acute upper respiratory infection    Plan:  Meds ordered this encounter  Medications  . azithromycin (ZITHROMAX Z-PAK) 250 MG tablet    Sig: Take 2 tablets (500 mg) on  Day 1,  followed by 1 tablet (250 mg) once daily on Days 2 through 5.    Dispense:  6 each    Refill:  0    Order Specific Question:   Supervising Provider    Answer:   Merlyn AlbertLUKING, WILLIAM S [[id number]]   OTC meds as directed for congestion and symptomatic care. Warning signs reviewed. Call back in 72 hours if no improvement, sooner if worse.

## 2016-10-26 ENCOUNTER — Telehealth: Payer: Self-pay | Admitting: Nurse Practitioner

## 2016-10-26 ENCOUNTER — Other Ambulatory Visit: Payer: Self-pay | Admitting: Nurse Practitioner

## 2016-10-26 MED ORDER — AMOXICILLIN-POT CLAVULANATE 875-125 MG PO TABS: 1.0000 | ORAL_TABLET | Freq: Two times a day (BID) | ORAL | 0 refills | Status: DC

## 2016-10-26 NOTE — Telephone Encounter (Signed)
Pt saw Eber JonesCarolyn on Friday and she said to call back if not getting better. On Z-pak, has one pill left but doesn't feel like it is working.  Still has all same symptoms, sore throat, cough, and congestion.  Wants to know if he should just wait it out or if she wants to switch his medicine to something else.  Please call patient at [phone].  [place] [facility] [place]. Thx

## 2016-10-26 NOTE — Telephone Encounter (Signed)
Notified patient sent in another antibiotic. Stop Zmax. Call back by end of the week if no better. Patient verbalized understanding.

## 2016-10-26 NOTE — Telephone Encounter (Signed)
Sent in another antibiotic. Stop Zmax. Call back by end of the week if no better.

## 2017-05-04 ENCOUNTER — Other Ambulatory Visit: Payer: Self-pay | Admitting: Family Medicine

## 2017-05-24 ENCOUNTER — Telehealth: Payer: Self-pay | Admitting: Family Medicine

## 2017-05-24 DIAGNOSIS — Z0000 Encounter for general adult medical examination without abnormal findings: Secondary | ICD-10-CM

## 2017-05-24 NOTE — Telephone Encounter (Signed)
Spoke with patient and informed him per Dr.Steve Luking- labs have been ordered for upcoming. Patient verbalized understanding.

## 2017-05-24 NOTE — Telephone Encounter (Signed)
Ok rep same, add psa if family hx of prost ca

## 2017-05-24 NOTE — Telephone Encounter (Signed)
Patient had Lipid, Liver amd Met 7 on 03/2016

## 2017-05-24 NOTE — Telephone Encounter (Signed)
Patient is seeing Dr. Brett CanalesSteve on 06/02/17.  He wants labs ordered to do before appointment.

## 2017-05-27 LAB — HEPATIC FUNCTION PANEL
ALT: 44 IU/L (ref 0–44)
AST: 26 IU/L (ref 0–40)
Albumin: 4.3 g/dL (ref 3.5–5.5)
Alkaline Phosphatase: 93 IU/L (ref 39–117)
BILIRUBIN TOTAL: 0.6 mg/dL (ref 0.0–1.2)
Bilirubin, Direct: 0.15 mg/dL (ref 0.00–0.40)
Total Protein: 6.8 g/dL (ref 6.0–8.5)

## 2017-05-27 LAB — LIPID PANEL
Chol/HDL Ratio: 4.8 ratio (ref 0.0–5.0)
Cholesterol, Total: 222 mg/dL — ABNORMAL HIGH (ref 100–199)
HDL: 46 mg/dL (ref >39)
LDL CALC: 152 mg/dL — AB (ref 0–99)
TRIGLYCERIDES: 121 mg/dL (ref 0–149)
VLDL Cholesterol Cal: 24 mg/dL (ref 5–40)

## 2017-05-27 LAB — PSA: Prostate Specific Ag, Serum: 0.3 ng/mL (ref 0.0–4.0)

## 2017-05-27 LAB — BASIC METABOLIC PANEL
BUN/Creatinine Ratio: 10 (ref 9–20)
BUN: 11 mg/dL (ref 6–24)
CALCIUM: 9.1 mg/dL (ref 8.7–10.2)
CO2: 24 mmol/L (ref 20–29)
Chloride: 101 mmol/L (ref 96–106)
Creatinine, Ser: 1.09 mg/dL (ref 0.76–1.27)
GFR calc Af Amer: 98 mL/min/{1.73_m2} (ref >59)
GFR, EST NON AFRICAN AMERICAN: 84 mL/min/{1.73_m2} (ref >59)
Glucose: 92 mg/dL (ref 65–99)
POTASSIUM: 4.2 mmol/L (ref 3.5–5.2)
SODIUM: 140 mmol/L (ref 134–144)

## 2017-06-02 ENCOUNTER — Ambulatory Visit (INDEPENDENT_AMBULATORY_CARE_PROVIDER_SITE_OTHER): Payer: BLUE CROSS/BLUE SHIELD | Admitting: Family Medicine

## 2017-06-02 ENCOUNTER — Encounter: Payer: Self-pay | Admitting: Family Medicine

## 2017-06-02 VITALS — BP 128/86 | Ht 70.0 in | Wt 280.0 lb

## 2017-06-02 DIAGNOSIS — E7800 Pure hypercholesterolemia, unspecified: Secondary | ICD-10-CM

## 2017-06-02 MED ORDER — PRAVASTATIN SODIUM 40 MG PO TABS: 40.0000 mg | ORAL_TABLET | Freq: Every day | ORAL | 1 refills | Status: DC

## 2017-06-02 NOTE — Progress Notes (Signed)
   Subjective:    Patient ID: Perry Huffman, male    DOB: 03-26-1976, 41 y.o.   MRN: [id number]  Hyperlipidemia  This is a chronic problem. The current episode started more than 1 year ago. Treatments tried: pravastatin 20mg .   Results for orders placed or performed in visit on 05/24/17  Lipid panel  Result Value Ref Range   Cholesterol, Total 222 (H) 100 - 199 mg/dL   Triglycerides 188 0 - 149 mg/dL   HDL 46 >67 mg/dL   VLDL Cholesterol Cal 24 5 - 40 mg/dL   LDL Calculated 737 (H) 0 - 99 mg/dL   Chol/HDL Ratio 4.8 0.0 - 5.0 ratio  Hepatic function panel  Result Value Ref Range   Total Protein 6.8 6.0 - 8.5 g/dL   Albumin 4.3 3.5 - 5.5 g/dL   Bilirubin Total 0.6 0.0 - 1.2 mg/dL   Bilirubin, Direct 3.66 0.00 - 0.40 mg/dL   Alkaline Phosphatase 93 39 - 117 IU/L   AST 26 0 - 40 IU/L   ALT 44 0 - 44 IU/L  Basic metabolic panel  Result Value Ref Range   Glucose 92 65 - 99 mg/dL   BUN 11 6 - 24 mg/dL   Creatinine, Ser 8.15 0.76 - 1.27 mg/dL   GFR calc non Af Amer 84 >59 mL/min/1.73   GFR calc Af Amer 98 >59 mL/min/1.73   BUN/Creatinine Ratio 10 9 - 20   Sodium 140 134 - 144 mmol/L   Potassium 4.2 3.5 - 5.2 mmol/L   Chloride 101 96 - 106 mmol/L   CO2 24 20 - 29 mmol/L   Calcium 9.1 8.7 - 10.2 mg/dL  PSA  Result Value Ref Range   Prostate Specific Ag, Serum 0.3 0.0 - 4.0 ng/mL   Diet was good but not the best now  Patient continues to take lipid medication regularly. No obvious side effects from it. Generally does not miss a dose. Prior blood work results are reviewed with patient. Patient continues to work on fat intake in diet    Review of Systems No headache, no major weight loss or weight gain, no chest pain no back pain abdominal pain no change in bowel habits complete ROS otherwise negative     Objective:   Physical Exam Alert vitals stable, NAD. Blood pressure good on repeat. HEENT normal. Lungs clear. Heart regular rate and rhythm.        Assessment & Plan:    Impression hyperlipidemia suboptimal discussed plan increase Pravachol to 40. Diet exercise discussed. Follow-up in 6 months

## 2017-10-25 ENCOUNTER — Encounter: Payer: Self-pay | Admitting: Family Medicine

## 2017-10-25 ENCOUNTER — Ambulatory Visit: Payer: Managed Care, Other (non HMO) | Admitting: Family Medicine

## 2017-10-25 VITALS — BP 148/98 | Ht 70.0 in | Wt 285.0 lb

## 2017-10-25 DIAGNOSIS — F411 Generalized anxiety disorder: Secondary | ICD-10-CM | POA: Diagnosis not present

## 2017-10-25 DIAGNOSIS — R000 Tachycardia, unspecified: Secondary | ICD-10-CM

## 2017-10-25 MED ORDER — ESCITALOPRAM OXALATE 10 MG PO TABS: 10.0000 mg | ORAL_TABLET | Freq: Every day | ORAL | 5 refills | Status: DC

## 2017-10-25 NOTE — Progress Notes (Signed)
   Subjective:    Patient ID: Perry Huffman, male    DOB: 05/30/1976, 42 y.o.   MRN: [id number]  HPI Patient is here today to follow up on stress/anxiety.He is not taking any thing for this at the moment.He would like to talk with you regarding starting something.   Has always had some challenges woth anxiety  Week before christmas woke up with panic attack. Was very sob and breathing rapidly. Heart was going fast . 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 getting with supervisor  Married, step kids, marriage ha  Been bad since start, loves his step kids, but has reached a point where something has to give,   Pt has cousin with sig anxiety issues  No hx of anxiety meds, never liked the idea of a pill, pt worries about OCD     No suicide thoughts, has christian faith and will not go along with this   In the midst of a divorce Review of Systems No headache, no major weight loss or weight gain, no chest pain no back pain abdominal pain no change in bowel habits complete ROS otherwise negative     Objective:   Physical Exam Alert and oriented, vitals reviewed and stable, NAD ENT-TM's and ext canals WNL bilat via otoscopic exam Soft palate, tonsils and post pharynx WNL via oropharyngeal exam Neck-symmetric, no masses; thyroid nonpalpable and nontender Pulmonary-no tachypnea or accessory muscle use; Clear without wheezes via auscultation Card--no abnrml murmurs, rhythm reg and rate WNL Carotid pulses symmetric, without bruits        Assessment & Plan:  1 impression 1.  Still rapid heart rate.  In.  Since that he has to get out of that current situation.  No family history of premature heart disease.  Patient self aware that this occurs almost always during periods of high anxiety.  Based on this recommend no major physiological workup at this  time  2.  Generalized anxiety disorder.  Long discussion held  Will initiate Lexapro 10 every morning.  Patient has history of seizures so will avoid Wellbutrin.  Still has some Xanax left over from before will use as needed follow-up in 6 weeks warning signs discussed  Greater than 50% of this 25 minute face to face visit was spent in counseling and discussion and coordination of care regarding the above diagnosis/diagnosies

## 2017-10-25 NOTE — Patient Instructions (Signed)

## 2017-12-13 ENCOUNTER — Telehealth: Payer: Self-pay | Admitting: Family Medicine

## 2017-12-13 DIAGNOSIS — Z79899 Other long term (current) drug therapy: Secondary | ICD-10-CM

## 2017-12-13 DIAGNOSIS — Z125 Encounter for screening for malignant neoplasm of prostate: Secondary | ICD-10-CM

## 2017-12-13 DIAGNOSIS — E785 Hyperlipidemia, unspecified: Secondary | ICD-10-CM

## 2017-12-13 NOTE — Telephone Encounter (Signed)
Blood work ordered in Epic. Patient notified. 

## 2017-12-13 NOTE — Telephone Encounter (Signed)
Patient had labs 05/2017: Lipid, liver, met 7 and PSA

## 2017-12-13 NOTE — Telephone Encounter (Signed)
Patient has an appointment on 12/21/17 with Dr. Brett CanalesSteve.  He is requesting orders for labs.

## 2017-12-13 NOTE — Telephone Encounter (Signed)
Rep same 

## 2017-12-17 LAB — HEPATIC FUNCTION PANEL
ALK PHOS: 85 IU/L (ref 39–117)
ALT: 41 IU/L (ref 0–44)
AST: 21 IU/L (ref 0–40)
Albumin: 4.4 g/dL (ref 3.5–5.5)
Bilirubin Total: 0.7 mg/dL (ref 0.0–1.2)
Bilirubin, Direct: 0.18 mg/dL (ref 0.00–0.40)
Total Protein: 6.7 g/dL (ref 6.0–8.5)

## 2017-12-17 LAB — LIPID PANEL
CHOLESTEROL TOTAL: 201 mg/dL — AB (ref 100–199)
Chol/HDL Ratio: 4.4 ratio (ref 0.0–5.0)
HDL: 46 mg/dL (ref >39)
LDL Calculated: 129 mg/dL — ABNORMAL HIGH (ref 0–99)
Triglycerides: 128 mg/dL (ref 0–149)
VLDL Cholesterol Cal: 26 mg/dL (ref 5–40)

## 2017-12-17 LAB — BASIC METABOLIC PANEL
BUN/Creatinine Ratio: 12 (ref 9–20)
BUN: 14 mg/dL (ref 6–24)
CO2: 24 mmol/L (ref 20–29)
Calcium: 8.5 mg/dL — ABNORMAL LOW (ref 8.7–10.2)
Chloride: 100 mmol/L (ref 96–106)
Creatinine, Ser: 1.19 mg/dL (ref 0.76–1.27)
GFR calc Af Amer: 87 mL/min/{1.73_m2} (ref >59)
GFR, EST NON AFRICAN AMERICAN: 75 mL/min/{1.73_m2} (ref >59)
Glucose: 92 mg/dL (ref 65–99)
POTASSIUM: 4.3 mmol/L (ref 3.5–5.2)
Sodium: 139 mmol/L (ref 134–144)

## 2017-12-17 LAB — PSA: Prostate Specific Ag, Serum: 0.3 ng/mL (ref 0.0–4.0)

## 2017-12-21 ENCOUNTER — Telehealth: Payer: Self-pay | Admitting: Family Medicine

## 2017-12-21 ENCOUNTER — Ambulatory Visit: Payer: Managed Care, Other (non HMO) | Admitting: Family Medicine

## 2017-12-21 ENCOUNTER — Encounter: Payer: Self-pay | Admitting: Family Medicine

## 2017-12-21 VITALS — BP 130/84 | Ht 70.0 in | Wt 284.0 lb

## 2017-12-21 DIAGNOSIS — F411 Generalized anxiety disorder: Secondary | ICD-10-CM

## 2017-12-21 DIAGNOSIS — E785 Hyperlipidemia, unspecified: Secondary | ICD-10-CM | POA: Diagnosis not present

## 2017-12-21 MED ORDER — PRAVASTATIN SODIUM 40 MG PO TABS: 40.0000 mg | ORAL_TABLET | Freq: Every day | ORAL | 1 refills | Status: DC

## 2017-12-21 MED ORDER — PRAVASTATIN SODIUM 80 MG PO TABS: 80.0000 mg | ORAL_TABLET | Freq: Every day | ORAL | 5 refills | Status: DC

## 2017-12-21 MED ORDER — ESCITALOPRAM OXALATE 20 MG PO TABS: 20.0000 mg | ORAL_TABLET | Freq: Every day | ORAL | 5 refills | Status: DC

## 2017-12-21 NOTE — Telephone Encounter (Signed)
Patient is aware 

## 2017-12-21 NOTE — Telephone Encounter (Signed)
Pt called stating that when he was seen earlier he told Dr. Brett CanalesSteve the incorrect dosage on his Prevastatin. Pt states that he is suppose to take 40 mg instead of the 80 that he told the Dr. This morning. Pt wants to know if that can be corrected.    [address]

## 2017-12-21 NOTE — Progress Notes (Signed)
   Subjective:    Patient ID: Perry MccallumCraig S Fecher, male    DOB: 06/08/1976, 42 y.o.   MRN: [id number]  Hyperlipidemia  This is a chronic problem. Treatments tried: pravastatin 80mg .   Pt states no concerns today. Needs refill on pravastatin.   Results for orders placed or performed in visit on 12/13/17  Lipid panel  Result Value Ref Range   Cholesterol, Total 201 (H) 100 - 199 mg/dL   Triglycerides 409128 0 - 149 mg/dL   HDL 46 >81>39 mg/dL   VLDL Cholesterol Cal 26 5 - 40 mg/dL   LDL Calculated 191129 (H) 0 - 99 mg/dL   Chol/HDL Ratio 4.4 0.0 - 5.0 ratio  Hepatic function panel  Result Value Ref Range   Total Protein 6.7 6.0 - 8.5 g/dL   Albumin 4.4 3.5 - 5.5 g/dL   Bilirubin Total 0.7 0.0 - 1.2 mg/dL   Bilirubin, Direct 4.780.18 0.00 - 0.40 mg/dL   Alkaline Phosphatase 85 39 - 117 IU/L   AST 21 0 - 40 IU/L   ALT 41 0 - 44 IU/L  Basic metabolic panel  Result Value Ref Range   Glucose 92 65 - 99 mg/dL   BUN 14 6 - 24 mg/dL   Creatinine, Ser 2.951.19 0.76 - 1.27 mg/dL   GFR calc non Af Amer 75 >59 mL/min/1.73   GFR calc Af Amer 87 >59 mL/min/1.73   BUN/Creatinine Ratio 12 9 - 20   Sodium 139 134 - 144 mmol/L   Potassium 4.3 3.5 - 5.2 mmol/L   Chloride 100 96 - 106 mmol/L   CO2 24 20 - 29 mmol/L   Calcium 8.5 (L) 8.7 - 10.2 mg/dL  PSA  Result Value Ref Range   Prostate Specific Ag, Serum 0.3 0.0 - 4.0 ng/mL   Not the best with the diet,  [employer] food not good   Patient continues to take lipid medication regularly. No obvious side effects from it. Generally does not miss a dose. Prior blood work results are reviewed with patient. Patient continues to work on fat intake in diet Patient notes the Lexapro has not helped very much.  He was hopeful that it would.  Wonders what the next step is.  Still suffering from anxiety  Review of Systems No headache, no major weight loss or weight gain, no chest pain no back pain abdominal pain no change in bowel habits complete ROS otherwise negative       Objective:   Physical Exam  Alert vitals stable, NAD. Blood pressure good on repeat. HEENT normal. Lungs clear. Heart regular rate and rhythm.       Assessment & Plan:  Impression hyperlipidemia good control discussed to maintain same meds  2.  Generalized anxiety disorder.  Suboptimal control discussed will increase Lexapro to 20 rationale discussed follow-up in 6 months

## 2017-12-21 NOTE — Telephone Encounter (Signed)
Change back to 40 mg daily

## 2017-12-22 ENCOUNTER — Encounter: Payer: Self-pay | Admitting: Family Medicine

## 2018-05-31 ENCOUNTER — Telehealth: Payer: Self-pay | Admitting: Family Medicine

## 2018-05-31 DIAGNOSIS — Z79899 Other long term (current) drug therapy: Secondary | ICD-10-CM

## 2018-05-31 DIAGNOSIS — E785 Hyperlipidemia, unspecified: Secondary | ICD-10-CM

## 2018-05-31 NOTE — Telephone Encounter (Signed)
Lip liv 

## 2018-05-31 NOTE — Telephone Encounter (Signed)
Appt. 06-14-18 for refill on medication.  Said he usually has his cholesterol checked before appt so needs an order put in to have Labs done at [facility].

## 2018-05-31 NOTE — Telephone Encounter (Signed)
Blood work ordered in Epic. Patient notified. 

## 2018-06-08 LAB — HEPATIC FUNCTION PANEL
ALBUMIN: 4.3 g/dL (ref 3.5–5.5)
ALT: 44 IU/L (ref 0–44)
AST: 25 IU/L (ref 0–40)
Alkaline Phosphatase: 87 IU/L (ref 39–117)
Bilirubin Total: 0.7 mg/dL (ref 0.0–1.2)
Bilirubin, Direct: 0.16 mg/dL (ref 0.00–0.40)
Total Protein: 6.8 g/dL (ref 6.0–8.5)

## 2018-06-08 LAB — LIPID PANEL
CHOL/HDL RATIO: 4.9 ratio (ref 0.0–5.0)
Cholesterol, Total: 211 mg/dL — ABNORMAL HIGH (ref 100–199)
HDL: 43 mg/dL (ref >39)
LDL CALC: 143 mg/dL — AB (ref 0–99)
Triglycerides: 127 mg/dL (ref 0–149)
VLDL Cholesterol Cal: 25 mg/dL (ref 5–40)

## 2018-06-14 ENCOUNTER — Encounter: Payer: Self-pay | Admitting: Family Medicine

## 2018-06-14 ENCOUNTER — Ambulatory Visit: Payer: Managed Care, Other (non HMO) | Admitting: Family Medicine

## 2018-06-14 VITALS — BP 124/84 | Ht 70.0 in | Wt 283.0 lb

## 2018-06-14 DIAGNOSIS — F411 Generalized anxiety disorder: Secondary | ICD-10-CM | POA: Diagnosis not present

## 2018-06-14 DIAGNOSIS — E785 Hyperlipidemia, unspecified: Secondary | ICD-10-CM | POA: Diagnosis not present

## 2018-06-14 DIAGNOSIS — Z23 Encounter for immunization: Secondary | ICD-10-CM | POA: Diagnosis not present

## 2018-06-14 MED ORDER — PRAVASTATIN SODIUM 40 MG PO TABS: 40.0000 mg | ORAL_TABLET | Freq: Every day | ORAL | 1 refills | Status: DC

## 2018-06-14 MED ORDER — ESCITALOPRAM OXALATE 10 MG PO TABS: 10.0000 mg | ORAL_TABLET | Freq: Every day | ORAL | 5 refills | Status: DC

## 2018-06-14 NOTE — Progress Notes (Signed)
   Subjective:    Patient ID: Perry Huffman, male    DOB: 1976-05-11, 42 y.o.   MRN: [id number]  HPI Patient is here today to follow up on his chronic illnesses.He states he is doing good. He is now 1/2 ing the lexapro. Results for orders placed or performed in visit on 05/31/18  Lipid panel  Result Value Ref Range   Cholesterol, Total 211 (H) 100 - 199 mg/dL   Triglycerides 834 0 - 149 mg/dL   HDL 43 >19 mg/dL   VLDL Cholesterol Cal 25 5 - 40 mg/dL   LDL Calculated 622 (H) 0 - 99 mg/dL   Chol/HDL Ratio 4.9 0.0 - 5.0 ratio  Hepatic function panel  Result Value Ref Range   Total Protein 6.8 6.0 - 8.5 g/dL   Albumin 4.3 3.5 - 5.5 g/dL   Bilirubin Total 0.7 0.0 - 1.2 mg/dL   Bilirubin, Direct 2.97 0.00 - 0.40 mg/dL   Alkaline Phosphatase 87 39 - 117 IU/L   AST 25 0 - 40 IU/L   ALT 44 0 - 44 IU/L   20 mg of lex drowsiness, handling the ten much better  Patient continues to take lipid medication regularly. No obvious side effects from it. Generally does not miss a dose. Prior blood work results are reviewed with patient. Patient continues to work on fat intake in diet  Compliant with th chol meds,   Diet horrible,,  Maintenance, at [employer], slowl     Review of Systems No headache, no major weight loss or weight gain, no chest pain no back pain abdominal pain no change in bowel habits complete ROS otherwise negative     Objective:   Physical Exam Alert vitals stable, NAD. Blood pressure good on repeat. HEENT normal. Lungs clear. Heart regular rate and rhythm.        Assessment & Plan:  Impression generalized anxiety disorder with element of depression.  Clinically improved.  Just on 10 mg handling well  2.  Hyperlipidemia.  LDL has risen a bit.  Diet discussed diet pretty much she is positive for the patient he is going to work on this we will maintain stenosis for now.  Follow-up in 6 months diet exercise discussed

## 2018-07-11 ENCOUNTER — Encounter: Payer: Self-pay | Admitting: Family Medicine

## 2018-07-11 ENCOUNTER — Ambulatory Visit: Payer: Managed Care, Other (non HMO) | Admitting: Family Medicine

## 2018-07-11 VITALS — BP 132/94 | Temp 99.1°F | Ht 70.0 in | Wt 285.6 lb

## 2018-07-11 DIAGNOSIS — R509 Fever, unspecified: Secondary | ICD-10-CM | POA: Diagnosis not present

## 2018-07-11 DIAGNOSIS — J019 Acute sinusitis, unspecified: Secondary | ICD-10-CM

## 2018-07-11 DIAGNOSIS — B9689 Other specified bacterial agents as the cause of diseases classified elsewhere: Secondary | ICD-10-CM

## 2018-07-11 MED ORDER — DOXYCYCLINE HYCLATE 100 MG PO TABS: 100.0000 mg | ORAL_TABLET | Freq: Two times a day (BID) | ORAL | 0 refills | Status: DC

## 2018-07-11 NOTE — Progress Notes (Signed)
   Subjective:    Patient ID: Perry Huffman, male    DOB: 06-23-1976, 42 y.o.   MRN: [id number]  Cough  This is a new problem. The current episode started 1 to 4 weeks ago. Associated symptoms include a fever, nasal congestion, rhinorrhea, a sore throat, sweats and wheezing. Pertinent negatives include no chest pain, chills or ear pain. Associated symptoms comments: Fever of 100.5 last night. Treatments tried: fever reducers, advil, ibuprofen, mucinex, allergy med. The treatment provided mild relief.   Started off with weed eating followed by head congestion drainage coughing discolored phlegm present for several days over the weekend some fever a little bit of body aches feeling tired fatigued rundown coughing up discolored phlegm denies shortness of breath   Review of Systems  Constitutional: Positive for fever. Negative for activity change and chills.  HENT: Positive for congestion, rhinorrhea and sore throat. Negative for ear pain.   Eyes: Negative for discharge.  Respiratory: Positive for cough and wheezing.   Cardiovascular: Negative for chest pain.  Gastrointestinal: Negative for nausea and vomiting.  Musculoskeletal: Negative for arthralgias.       Objective:   Physical Exam  Constitutional: He appears well-developed.  HENT:  Head: Normocephalic.  Mouth/Throat: Oropharynx is clear and moist. No oropharyngeal exudate.  Neck: Normal range of motion.  Cardiovascular: Normal rate, regular rhythm and normal heart sounds.  No murmur heard. Pulmonary/Chest: Effort normal and breath sounds normal. He has no wheezes.  Lymphadenopathy:    He has no cervical adenopathy.  Neurological: He exhibits normal muscle tone.  Skin: Skin is warm and dry.  Nursing note and vitals reviewed.         Assessment & Plan:  I suspect this started off after his pollen exposure from weed eating This developed into a mild sinus infection along with bronchitis No sign of the flu I doubt  pneumonia Certainly possible the patient has a mild viral parainfluenza that developed over the weekend Warning signs discussed in detail Follow-up if progressive troubles or worse Antibiotics recommended.

## 2018-07-11 NOTE — Patient Instructions (Signed)
Take the antibiotic 1 twice daily with a snack in a tall glass of water  If you continuously get worse as the week goes on please let us know  Tylenol as needed for fever  You should be much better by later this week

## 2018-12-23 ENCOUNTER — Other Ambulatory Visit: Payer: Self-pay | Admitting: Family Medicine

## 2018-12-23 ENCOUNTER — Telehealth: Payer: Self-pay | Admitting: Family Medicine

## 2018-12-23 DIAGNOSIS — E785 Hyperlipidemia, unspecified: Secondary | ICD-10-CM

## 2018-12-23 DIAGNOSIS — Z0000 Encounter for general adult medical examination without abnormal findings: Secondary | ICD-10-CM

## 2018-12-23 DIAGNOSIS — Z79899 Other long term (current) drug therapy: Secondary | ICD-10-CM

## 2018-12-23 NOTE — Telephone Encounter (Signed)
Lip liv m7 

## 2018-12-23 NOTE — Telephone Encounter (Signed)
Last labs 05/2018- Lipid and Liver

## 2018-12-23 NOTE — Telephone Encounter (Signed)
Patient has appointment on 3/23 for six month follow up and needing labs done.

## 2018-12-23 NOTE — Telephone Encounter (Signed)
Lab orders placed and pt is aware 

## 2018-12-25 LAB — HEPATIC FUNCTION PANEL
ALBUMIN: 4.4 g/dL (ref 4.0–5.0)
ALT: 39 IU/L (ref 0–44)
AST: 21 IU/L (ref 0–40)
Alkaline Phosphatase: 84 IU/L (ref 39–117)
BILIRUBIN TOTAL: 0.5 mg/dL (ref 0.0–1.2)
BILIRUBIN, DIRECT: 0.14 mg/dL (ref 0.00–0.40)
Total Protein: 6.9 g/dL (ref 6.0–8.5)

## 2018-12-25 LAB — LIPID PANEL
CHOL/HDL RATIO: 4.9 ratio (ref 0.0–5.0)
Cholesterol, Total: 205 mg/dL — ABNORMAL HIGH (ref 100–199)
HDL: 42 mg/dL (ref >39)
LDL Calculated: 130 mg/dL — ABNORMAL HIGH (ref 0–99)
Triglycerides: 165 mg/dL — ABNORMAL HIGH (ref 0–149)
VLDL Cholesterol Cal: 33 mg/dL (ref 5–40)

## 2018-12-25 LAB — BASIC METABOLIC PANEL
BUN/Creatinine Ratio: 15 (ref 9–20)
BUN: 17 mg/dL (ref 6–24)
CALCIUM: 9.6 mg/dL (ref 8.7–10.2)
CO2: 20 mmol/L (ref 20–29)
Chloride: 101 mmol/L (ref 96–106)
Creatinine, Ser: 1.16 mg/dL (ref 0.76–1.27)
GFR calc Af Amer: 89 mL/min/{1.73_m2} (ref >59)
GFR, EST NON AFRICAN AMERICAN: 77 mL/min/{1.73_m2} (ref >59)
Glucose: 59 mg/dL — ABNORMAL LOW (ref 65–99)
POTASSIUM: 4.2 mmol/L (ref 3.5–5.2)
Sodium: 141 mmol/L (ref 134–144)

## 2018-12-27 ENCOUNTER — Other Ambulatory Visit: Payer: Self-pay | Admitting: Family Medicine

## 2019-01-02 ENCOUNTER — Ambulatory Visit: Payer: Managed Care, Other (non HMO) | Admitting: Family Medicine

## 2019-01-02 ENCOUNTER — Other Ambulatory Visit: Payer: Self-pay

## 2019-01-02 VITALS — BP 128/86 | Ht 70.0 in | Wt 285.4 lb

## 2019-01-02 DIAGNOSIS — E785 Hyperlipidemia, unspecified: Secondary | ICD-10-CM

## 2019-01-02 DIAGNOSIS — F411 Generalized anxiety disorder: Secondary | ICD-10-CM

## 2019-01-02 MED ORDER — ESCITALOPRAM OXALATE 10 MG PO TABS: ORAL_TABLET | ORAL | 5 refills | Status: DC

## 2019-01-02 MED ORDER — PRAVASTATIN SODIUM 40 MG PO TABS: 40.0000 mg | ORAL_TABLET | Freq: Every day | ORAL | 1 refills | Status: DC

## 2019-01-02 NOTE — Progress Notes (Signed)
   Subjective:    Patient ID: Perry Huffman, male    DOB: 1975-11-13, 43 y.o.   MRN: [id number]  Hyperlipidemia  This is a chronic problem. Treatments tried: pravastatin. There are no compliance problems.  Risk factors for coronary artery disease include dyslipidemia and obesity.    Patient continues to take lipid medication regularly. No obvious side effects from it. Generally does not miss a dose. Prior blood work results are reviewed with patient. Patient continues to work on fat intake in diet  Results for orders placed or performed in visit on 12/23/18  Basic Metabolic Panel (BMET)  Result Value Ref Range   Glucose 59 (L) 65 - 99 mg/dL   BUN 17 6 - 24 mg/dL   Creatinine, Ser 2.19 0.76 - 1.27 mg/dL   GFR calc non Af Amer 77 >59 mL/min/1.73   GFR calc Af Amer 89 >59 mL/min/1.73   BUN/Creatinine Ratio 15 9 - 20   Sodium 141 134 - 144 mmol/L   Potassium 4.2 3.5 - 5.2 mmol/L   Chloride 101 96 - 106 mmol/L   CO2 20 20 - 29 mmol/L   Calcium 9.6 8.7 - 10.2 mg/dL  Hepatic function panel  Result Value Ref Range   Total Protein 6.9 6.0 - 8.5 g/dL   Albumin 4.4 4.0 - 5.0 g/dL   Bilirubin Total 0.5 0.0 - 1.2 mg/dL   Bilirubin, Direct 7.58 0.00 - 0.40 mg/dL   Alkaline Phosphatase 84 39 - 117 IU/L   AST 21 0 - 40 IU/L   ALT 39 0 - 44 IU/L  Lipid Profile  Result Value Ref Range   Cholesterol, Total 205 (H) 100 - 199 mg/dL   Triglycerides 832 (H) 0 - 149 mg/dL   HDL 42 >54 mg/dL   VLDL Cholesterol Cal 33 5 - 40 mg/dL   LDL Calculated 982 (H) 0 - 99 mg/dL   Chol/HDL Ratio 4.9 0.0 - 5.0 ratio     Review of Systems No headache, no major weight loss or weight gain, no chest pain no back pain abdominal pain no change in bowel habits complete ROS otherwise negative     Objective:   Physical Exam  Alert vitals stable, NAD. Blood pressure good on repeat. HEENT normal. Lungs clear. Heart regular rate and rhythm.       Assessment & Plan:  Impression hyperlipidemia.  Control good  discussed to maintain meds diet exercise discussed  2.  Chronic generalized anxiety disorder.  Patient states stable fine doing well  Diet exercise discussed medication refill follow-up 6 months

## 2019-05-22 ENCOUNTER — Telehealth: Payer: Self-pay | Admitting: Family Medicine

## 2019-05-22 DIAGNOSIS — E785 Hyperlipidemia, unspecified: Secondary | ICD-10-CM

## 2019-05-22 DIAGNOSIS — Z79899 Other long term (current) drug therapy: Secondary | ICD-10-CM

## 2019-05-22 NOTE — Telephone Encounter (Signed)
Pt has med check scheduled for 8/19. He would like to have his lab work done before appt.

## 2019-05-22 NOTE — Telephone Encounter (Signed)
Last labs on 12/24/2018 lip liver and bmet. Please advise. Thank you

## 2019-05-22 NOTE — Telephone Encounter (Signed)
Lip liv 

## 2019-05-22 NOTE — Telephone Encounter (Signed)
Lab orders placed and pt is aware 

## 2019-05-25 LAB — HEPATIC FUNCTION PANEL
ALT: 49 IU/L — ABNORMAL HIGH (ref 0–44)
AST: 27 IU/L (ref 0–40)
Albumin: 4.1 g/dL (ref 4.0–5.0)
Alkaline Phosphatase: 83 IU/L (ref 39–117)
Bilirubin Total: 0.5 mg/dL (ref 0.0–1.2)
Bilirubin, Direct: 0.14 mg/dL (ref 0.00–0.40)
Total Protein: 6.7 g/dL (ref 6.0–8.5)

## 2019-05-25 LAB — LIPID PANEL
Chol/HDL Ratio: 4.5 ratio (ref 0.0–5.0)
Cholesterol, Total: 185 mg/dL (ref 100–199)
HDL: 41 mg/dL (ref >39)
LDL Calculated: 113 mg/dL — ABNORMAL HIGH (ref 0–99)
Triglycerides: 153 mg/dL — ABNORMAL HIGH (ref 0–149)
VLDL Cholesterol Cal: 31 mg/dL (ref 5–40)

## 2019-05-31 ENCOUNTER — Ambulatory Visit (INDEPENDENT_AMBULATORY_CARE_PROVIDER_SITE_OTHER): Payer: Managed Care, Other (non HMO) | Admitting: Family Medicine

## 2019-05-31 ENCOUNTER — Other Ambulatory Visit: Payer: Self-pay

## 2019-05-31 DIAGNOSIS — F411 Generalized anxiety disorder: Secondary | ICD-10-CM

## 2019-05-31 DIAGNOSIS — E785 Hyperlipidemia, unspecified: Secondary | ICD-10-CM

## 2019-05-31 MED ORDER — ESCITALOPRAM OXALATE 10 MG PO TABS: ORAL_TABLET | ORAL | 5 refills | Status: DC

## 2019-05-31 MED ORDER — PRAVASTATIN SODIUM 40 MG PO TABS: 40.0000 mg | ORAL_TABLET | Freq: Every day | ORAL | 1 refills | Status: DC

## 2019-05-31 NOTE — Progress Notes (Signed)
   Subjective:    Patient ID: Perry Huffman, male    DOB: 12/05/1975, 43 y.o.   MRN: [id number]  Hyperlipidemia This is a chronic problem. The current episode started more than 1 year ago. Treatments tried: pravachol. There are no compliance problems.  Risk factors for coronary artery disease include dyslipidemia.      Review of Systems Virtual Visit via Video Note  I connected with Perry Huffman on 05/31/19 at  1:10 PM EDT by a video enabled telemedicine application and verified that I am speaking with the correct person using two identifiers.  Location: Patient: home Provider: office   I discussed the limitations of evaluation and management by telemedicine and the availability of in person appointments. The patient expressed understanding and agreed to proceed.  History of Present Illness:    Observations/Objective:   Assessment and Plan:   Follow Up Instructions:    I discussed the assessment and treatment plan with the patient. The patient was provided an opportunity to ask questions and all were answered. The patient agreed with the plan and demonstrated an understanding of the instructions.   The patient was advised to call back or seek an in-person evaluation if the symptoms worsen or if the condition fails to improve as anticipated.  I provided 18 minutes of non-face-to-face time during this encounter.  Results for orders placed or performed in visit on 05/22/19  Lipid Profile  Result Value Ref Range   Cholesterol, Total 185 100 - 199 mg/dL   Triglycerides 153 (H) 0 - 149 mg/dL   HDL 41 >39 mg/dL   VLDL Cholesterol Cal 31 5 - 40 mg/dL   LDL Calculated 113 (H) 0 - 99 mg/dL   Chol/HDL Ratio 4.5 0.0 - 5.0 ratio  Hepatic function panel  Result Value Ref Range   Total Protein 6.7 6.0 - 8.5 g/dL   Albumin 4.1 4.0 - 5.0 g/dL   Bilirubin Total 0.5 0.0 - 1.2 mg/dL   Bilirubin, Direct 0.14 0.00 - 0.40 mg/dL   Alkaline Phosphatase 83 39 - 117 IU/L   AST 27 0 - 40  IU/L   ALT 49 (H) 0 - 44 IU/L   Chronic anxiety overall well stabilized with the medication.  No obvious side effects.  Compliant with it.  Feels definitely helps him  Patient continues to take lipid medication regularly. No obvious side effects from it. Generally does not miss a dose. Prior blood work results are reviewed with patient. Patient continues to work on fat intake in diet  No headache no chest pain or back pain     Objective:   Physical Exam Virtual       Assessment & Plan:  Impression hyperlipidemia.  Compliance discussed.  Diet discussed.  Medications refilled  2.  Generalized anxiety disorder.  With ongoing need for surgery ongoing discussed to maintain

## 2019-05-31 NOTE — Addendum Note (Signed)
Addended by: Dairl Ponder on: 05/31/2019 03:31 PM   Modules accepted: Orders

## 2019-08-18 ENCOUNTER — Other Ambulatory Visit: Payer: Self-pay

## 2019-08-18 DIAGNOSIS — Z20822 Contact with and (suspected) exposure to covid-19: Secondary | ICD-10-CM

## 2019-08-19 LAB — NOVEL CORONAVIRUS, NAA: SARS-CoV-2, NAA: NOT DETECTED

## 2019-10-10 ENCOUNTER — Other Ambulatory Visit: Payer: Self-pay

## 2019-10-10 ENCOUNTER — Ambulatory Visit: Payer: No Typology Code available for payment source | Attending: Internal Medicine

## 2019-10-10 DIAGNOSIS — Z20822 Contact with and (suspected) exposure to covid-19: Secondary | ICD-10-CM

## 2019-10-12 LAB — NOVEL CORONAVIRUS, NAA: SARS-CoV-2, NAA: NOT DETECTED

## 2019-11-13 ENCOUNTER — Encounter: Payer: Self-pay | Admitting: Family Medicine

## 2020-01-01 ENCOUNTER — Encounter: Payer: Self-pay | Admitting: Family Medicine

## 2020-01-01 ENCOUNTER — Other Ambulatory Visit: Payer: Self-pay

## 2020-01-01 ENCOUNTER — Ambulatory Visit: Payer: Managed Care, Other (non HMO) | Admitting: Family Medicine

## 2020-01-01 VITALS — BP 132/82 | Temp 96.3°F | Wt 288.0 lb

## 2020-01-01 DIAGNOSIS — E785 Hyperlipidemia, unspecified: Secondary | ICD-10-CM | POA: Diagnosis not present

## 2020-01-01 DIAGNOSIS — F411 Generalized anxiety disorder: Secondary | ICD-10-CM

## 2020-01-01 DIAGNOSIS — Z79899 Other long term (current) drug therapy: Secondary | ICD-10-CM | POA: Diagnosis not present

## 2020-01-01 DIAGNOSIS — H6120 Impacted cerumen, unspecified ear: Secondary | ICD-10-CM

## 2020-01-01 MED ORDER — PRAVASTATIN SODIUM 40 MG PO TABS: 40.0000 mg | ORAL_TABLET | Freq: Every day | ORAL | 1 refills | Status: DC

## 2020-01-01 MED ORDER — ESCITALOPRAM OXALATE 10 MG PO TABS: ORAL_TABLET | ORAL | 5 refills | Status: DC

## 2020-01-01 NOTE — Progress Notes (Signed)
   Subjective:    Patient ID: Perry Huffman, male    DOB: 06-18-1976, 44 y.o.   MRN: [id number]  HPI Pt here today due to right ear being impacted. Pt states he has had this for awhile. Has tried to use OTC treatments but no relief. ENT states they will not see patient without referral. Pt would like to see Dr.Wolicki at [facility].   Patient continues to take lipid medication regularly. No obvious side effects from it. Generally does not miss a dose. Prior blood work results are reviewed with patient. Patient continues to work on fat intake in diet  Exercising for the last month or so  works at [employer]      Review of Systems No headache, no major weight loss or weight gain, no chest pain no back pain abdominal pain no change in bowel habits complete ROS otherwise negative     Objective:   Physical Exam  Alert vitals stable, NAD. Blood pressure good on repeat. HEENT right ear completely impacted with wax otherwise normal. Lungs clear. Heart regular rate and rhythm.      Assessment & Plan:  Impression 1 cerumen impaction.  Discussed.  ENT referral  2.  Hyperlipidemia.  Exact status uncertain check blood work medications refilled diet discussed 6 months with given  3.  Chronic mood disorder.  Definitely improved on medication meds refilled  Follow-up in 6 months

## 2020-01-02 ENCOUNTER — Ambulatory Visit: Payer: Managed Care, Other (non HMO) | Admitting: Family Medicine

## 2020-01-03 LAB — LIPID PANEL
Chol/HDL Ratio: 4 ratio (ref 0.0–5.0)
Cholesterol, Total: 184 mg/dL (ref 100–199)
HDL: 46 mg/dL (ref >39)
LDL Chol Calc (NIH): 115 mg/dL — ABNORMAL HIGH (ref 0–99)
Triglycerides: 126 mg/dL (ref 0–149)
VLDL Cholesterol Cal: 23 mg/dL (ref 5–40)

## 2020-01-03 LAB — BASIC METABOLIC PANEL
BUN/Creatinine Ratio: 12 (ref 9–20)
BUN: 14 mg/dL (ref 6–24)
CO2: 25 mmol/L (ref 20–29)
Calcium: 9.6 mg/dL (ref 8.7–10.2)
Chloride: 104 mmol/L (ref 96–106)
Creatinine, Ser: 1.16 mg/dL (ref 0.76–1.27)
GFR calc Af Amer: 89 mL/min/{1.73_m2} (ref >59)
GFR calc non Af Amer: 77 mL/min/{1.73_m2} (ref >59)
Glucose: 94 mg/dL (ref 65–99)
Potassium: 4.3 mmol/L (ref 3.5–5.2)
Sodium: 140 mmol/L (ref 134–144)

## 2020-01-03 LAB — HEPATIC FUNCTION PANEL
ALT: 42 IU/L (ref 0–44)
AST: 26 IU/L (ref 0–40)
Albumin: 4.4 g/dL (ref 4.0–5.0)
Alkaline Phosphatase: 85 IU/L (ref 39–117)
Bilirubin Total: 0.8 mg/dL (ref 0.0–1.2)
Bilirubin, Direct: 0.19 mg/dL (ref 0.00–0.40)
Total Protein: 6.8 g/dL (ref 6.0–8.5)

## 2020-01-04 ENCOUNTER — Encounter: Payer: Self-pay | Admitting: Family Medicine

## 2020-01-07 ENCOUNTER — Encounter: Payer: Self-pay | Admitting: Family Medicine

## 2020-02-29 ENCOUNTER — Emergency Department (HOSPITAL_COMMUNITY): Payer: Managed Care, Other (non HMO)

## 2020-02-29 ENCOUNTER — Other Ambulatory Visit: Payer: Self-pay

## 2020-02-29 ENCOUNTER — Emergency Department (HOSPITAL_COMMUNITY)
Admission: EM | Admit: 2020-02-29 | Discharge: 2020-02-29 | Disposition: A | Discharge status: Home / Self Care | Payer: Managed Care, Other (non HMO) | Attending: Emergency Medicine | Admitting: Emergency Medicine

## 2020-02-29 ENCOUNTER — Encounter (HOSPITAL_COMMUNITY): Payer: Self-pay | Admitting: *Deleted

## 2020-02-29 DIAGNOSIS — Z79899 Other long term (current) drug therapy: Secondary | ICD-10-CM | POA: Insufficient documentation

## 2020-02-29 DIAGNOSIS — N200 Calculus of kidney: Secondary | ICD-10-CM | POA: Diagnosis not present

## 2020-02-29 DIAGNOSIS — R319 Hematuria, unspecified: Secondary | ICD-10-CM | POA: Diagnosis not present

## 2020-02-29 DIAGNOSIS — R109 Unspecified abdominal pain: Secondary | ICD-10-CM | POA: Diagnosis present

## 2020-02-29 LAB — CBC WITH DIFFERENTIAL/PLATELET
Abs Immature Granulocytes: 0.07 10*3/uL (ref 0.00–0.07)
Basophils Absolute: 0.1 10*3/uL (ref 0.0–0.1)
Basophils Relative: 0 %
Eosinophils Absolute: 0 10*3/uL (ref 0.0–0.5)
Eosinophils Relative: 0 %
HCT: 49.1 % (ref 39.0–52.0)
Hemoglobin: 16.2 g/dL (ref 13.0–17.0)
Immature Granulocytes: 1 %
Lymphocytes Relative: 7 %
Lymphs Abs: 0.8 10*3/uL (ref 0.7–4.0)
MCH: 30.7 pg (ref 26.0–34.0)
MCHC: 33 g/dL (ref 30.0–36.0)
MCV: 93.2 fL (ref 80.0–100.0)
Monocytes Absolute: 0.6 10*3/uL (ref 0.1–1.0)
Monocytes Relative: 5 %
Neutro Abs: 10.6 10*3/uL — ABNORMAL HIGH (ref 1.7–7.7)
Neutrophils Relative %: 87 %
Platelets: 242 10*3/uL (ref 150–400)
RBC: 5.27 MIL/uL (ref 4.22–5.81)
RDW: 12.3 % (ref 11.5–15.5)
WBC: 12.2 10*3/uL — ABNORMAL HIGH (ref 4.0–10.5)
nRBC: 0 % (ref 0.0–0.2)

## 2020-02-29 LAB — BASIC METABOLIC PANEL
Anion gap: 10 (ref 5–15)
BUN: 19 mg/dL (ref 6–20)
CO2: 26 mmol/L (ref 22–32)
Calcium: 9.5 mg/dL (ref 8.9–10.3)
Chloride: 101 mmol/L (ref 98–111)
Creatinine, Ser: 1.64 mg/dL — ABNORMAL HIGH (ref 0.61–1.24)
GFR calc Af Amer: 58 mL/min — ABNORMAL LOW (ref >60)
GFR calc non Af Amer: 50 mL/min — ABNORMAL LOW (ref >60)
Glucose, Bld: 122 mg/dL — ABNORMAL HIGH (ref 70–99)
Potassium: 3.9 mmol/L (ref 3.5–5.1)
Sodium: 137 mmol/L (ref 135–145)

## 2020-02-29 LAB — URINALYSIS, ROUTINE W REFLEX MICROSCOPIC
Bacteria, UA: NONE SEEN
Bilirubin Urine: NEGATIVE
Glucose, UA: NEGATIVE mg/dL
Ketones, ur: 5 mg/dL — AB
Leukocytes,Ua: NEGATIVE
Nitrite: NEGATIVE
Protein, ur: 30 mg/dL — AB
RBC / HPF: >50 RBC/hpf — ABNORMAL HIGH (ref 0–5)
Specific Gravity, Urine: 1.032 — ABNORMAL HIGH (ref 1.005–1.030)
pH: 5 (ref 5.0–8.0)

## 2020-02-29 MED ORDER — TAMSULOSIN HCL 0.4 MG PO CAPS: 0.4000 mg | ORAL_CAPSULE | Freq: Every day | ORAL | 0 refills | Status: AC

## 2020-02-29 MED ORDER — CEPHALEXIN 500 MG PO CAPS: 500.0000 mg | ORAL_CAPSULE | Freq: Four times a day (QID) | ORAL | 0 refills | Status: AC

## 2020-02-29 MED ORDER — ONDANSETRON 4 MG PO TBDP
4.0000 mg | ORAL_TABLET | Freq: Three times a day (TID) | ORAL | 0 refills | Status: DC | PRN
Start: 2020-02-29 — End: 2020-06-10

## 2020-02-29 MED ORDER — OXYCODONE-ACETAMINOPHEN 5-325 MG PO TABS: 1.0000 | ORAL_TABLET | Freq: Three times a day (TID) | ORAL | 0 refills | Status: DC | PRN

## 2020-02-29 NOTE — ED Triage Notes (Signed)
Pt with left flank pain since this morning, has noticed blood in urine.  Pt with nausea, denies emesis.

## 2020-02-29 NOTE — ED Provider Notes (Signed)
[facility] EMERGENCY DEPARTMENT Provider Note   CSN: [id number] Arrival date & time: 02/29/20  1327     History Chief Complaint  Patient presents with  . Flank Pain    Perry Huffman is a 44 y.o. male who presents for evaluation of left flank pain that began about 10 AM this morning.  He reports that he woke up and started having some pain in his left flank/side.  He reports that has he got up and started doing things around the house, the pain started radiating to his left abdomen.  He states that he felt like it radiated down into his groin.  He did not have any associated nausea/vomiting.  He states that he did notice he had some hematuria that began yesterday but no dysuria.  He reports that he frequently gets UTIs and was concerned that this may be the beginning of UTI.  He states that he does not think he has had a history of kidney stone.  He has not taken anything for the pain.  He reports that in the waiting room, his pain improved.  Denies any recent fever, chest pain, difficulty breathing, nausea/vomiting, testicular pain or swelling, penile pain or swelling.  The history is provided by the patient.       History reviewed. No pertinent past medical history.  Patient Active Problem List   Diagnosis Date Noted  . Chest pain 12/03/2013  . Generalized anxiety disorder 12/03/2013  . Hyperlipidemia LDL goal <130 01/10/2013  . MUSCLE STRAIN, LEFT CALF 07/02/2008    History reviewed. No pertinent surgical history.     History reviewed. No pertinent family history.  Social History   Tobacco Use  . Smoking status: Never Smoker  . Smokeless tobacco: Never Used  Substance Use Topics  . Alcohol use: Yes    Comment: occ.   . Drug use: Never    Home Medications Prior to Admission medications   Medication Sig Start Date End Date Taking? Authorizing Provider  escitalopram (LEXAPRO) 10 MG tablet TAKE 1 TABLET(10 MG) BY MOUTH DAILY Patient taking differently: Take 10 mg  by mouth in the morning.  01/01/20  Yes Merlyn Albert, MD  pravastatin (PRAVACHOL) 40 MG tablet Take 1 tablet (40 mg total) by mouth daily. Patient taking differently: Take 40 mg by mouth in the morning.  01/01/20  Yes Merlyn Albert, MD  cephALEXin (KEFLEX) 500 MG capsule Take 1 capsule (500 mg total) by mouth 4 (four) times daily for 7 days. 02/29/20 03/07/20  Maxwell Caul, PA-C  ondansetron (ZOFRAN ODT) 4 MG disintegrating tablet Take 1 tablet (4 mg total) by mouth every 8 (eight) hours as needed for nausea or vomiting. 02/29/20   Maxwell Caul, PA-C  oxyCODONE-acetaminophen (PERCOCET/ROXICET) 5-325 MG tablet Take 1-2 tablets by mouth every 8 (eight) hours as needed for severe pain. 02/29/20   Maxwell Caul, PA-C  tamsulosin (FLOMAX) 0.4 MG CAPS capsule Take 1 capsule (0.4 mg total) by mouth daily for 7 days. 02/29/20 03/07/20  Maxwell Caul, PA-C    Allergies    Patient has no known allergies.  Review of Systems   Review of Systems  Constitutional: Negative for fever.  Respiratory: Negative for cough and shortness of breath.   Cardiovascular: Negative for chest pain.  Gastrointestinal: Positive for abdominal pain. Negative for nausea and vomiting.  Genitourinary: Positive for flank pain and hematuria. Negative for dysuria.  Neurological: Negative for headaches.  All other systems reviewed and are negative.  Physical Exam Updated Vital Signs BP 134/79 (BP Location: Right Arm)   Pulse 72   Temp 99.2 F (37.3 C) (Oral)   Resp 16   Ht 5\' 9"  (1.753 m)   Wt 129.3 kg   SpO2 100%   BMI 42.09 kg/m   Physical Exam Vitals and nursing note reviewed.  Constitutional:      Appearance: Normal appearance. He is well-developed.     Comments: Sitting comfortably on examination table  HENT:     Head: Normocephalic and atraumatic.  Eyes:     General: Lids are normal.     Conjunctiva/sclera: Conjunctivae normal.     Pupils: Pupils are equal, round, and reactive to light.   Cardiovascular:     Rate and Rhythm: Normal rate and regular rhythm.     Pulses: Normal pulses.     Heart sounds: Normal heart sounds. No murmur. No friction rub. No gallop.   Pulmonary:     Effort: Pulmonary effort is normal.     Breath sounds: Normal breath sounds.  Abdominal:     Palpations: Abdomen is soft. Abdomen is not rigid.     Tenderness: There is no abdominal tenderness. There is no left CVA tenderness or guarding.     Comments: Abdomen is soft, nondistended.  No tenderness palpation noted.  No rigidity, guarding.  No CVA tenderness noted bilaterally.  Musculoskeletal:        General: Normal range of motion.     Cervical back: Full passive range of motion without pain.  Skin:    General: Skin is warm and dry.     Capillary Refill: Capillary refill takes less than 2 seconds.  Neurological:     Mental Status: He is alert and oriented to person, place, and time.  Psychiatric:        Speech: Speech normal.     ED Results / Procedures / Treatments   Labs (all labs ordered are listed, but only abnormal results are displayed) Labs Reviewed  URINALYSIS, ROUTINE W REFLEX MICROSCOPIC - Abnormal; Notable for the following components:      Result Value   APPearance HAZY (*)    Specific Gravity, Urine 1.032 (*)    Hgb urine dipstick LARGE (*)    Ketones, ur 5 (*)    Protein, ur 30 (*)    RBC / HPF >50 (*)    All other components within normal limits  BASIC METABOLIC PANEL - Abnormal; Notable for the following components:   Glucose, Bld 122 (*)    Creatinine, Ser 1.64 (*)    GFR calc non Af Amer 50 (*)    GFR calc Af Amer 58 (*)    All other components within normal limits  CBC WITH DIFFERENTIAL/PLATELET - Abnormal; Notable for the following components:   WBC 12.2 (*)    Neutro Abs 10.6 (*)    All other components within normal limits    EKG None  Radiology CT Renal Stone Study  Result Date: 02/29/2020 CLINICAL DATA:  Left flank pain. EXAM: CT ABDOMEN AND PELVIS  WITHOUT CONTRAST TECHNIQUE: Multidetector CT imaging of the abdomen and pelvis was performed following the standard protocol without IV contrast. COMPARISON:  None. FINDINGS: Lower chest: No acute abnormality. Hepatobiliary: No focal liver abnormality is seen. Diffuse fatty infiltration of the liver parenchyma is seen. No gallstones, gallbladder wall thickening, or biliary dilatation. Pancreas: Unremarkable. No pancreatic ductal dilatation or surrounding inflammatory changes. Spleen: Normal in size without focal abnormality. Adrenals/Urinary Tract: Adrenal glands are  unremarkable. Kidneys are normal in size, without focal lesions. A 3 mm obstructing renal stone is seen within the distal left ureter with mild left-sided hydronephrosis, hydroureter and perinephric inflammatory fat stranding. Bladder is unremarkable. Stomach/Bowel: Stomach is within normal limits. Appendix appears normal. No evidence of bowel wall thickening, distention, or inflammatory changes. Vascular/Lymphatic: No significant vascular findings are present. No enlarged abdominal or pelvic lymph nodes. Reproductive: Prostate is unremarkable. Other: No abdominal wall hernia or abnormality. No abdominopelvic ascites. Musculoskeletal: No acute or significant osseous findings. IMPRESSION: 1. 3 mm obstructing renal stone within the distal left ureter with mild left-sided hydronephrosis, hydroureter and perinephric inflammatory fat stranding. 2. Hepatic steatosis. Electronically Signed   By: Aram Candela M.D.   On: 02/29/2020 18:32    Procedures Procedures (including critical care time)  Medications Ordered in ED Medications - No data to display  ED Course  I have reviewed the triage vital signs and the nursing notes.  Pertinent labs & imaging results that were available during my care of the patient were reviewed by me and considered in my medical decision making (see chart for details).    MDM Rules/Calculators/A&P                       44 year old male who presents for evaluation of left flank and abdominal pain that began today.  Also reports hematuria.  No dysuria, nausea/vomiting.  History of UTIs.  He reports that his pain improved while he was in the waiting room.  On initially arrival, he is afebrile, nontoxic-appearing.  Vital signs are stable.  On exam, his pain is improved and does not have much tenderness.  We will plan to check urine.  He may have had a recently passed stone.  UA with large hemoglobin.  No evidence of infectious etiology.  Given large hemoglobin and flank pain, will plan for CT scan.  CBC shows slight leukocytosis of 12.2.  His BMP shows BUN of 19, creatinine is slightly above baseline at 1.64.  His baseline appears to be 1.2.  CT renal study shows 3 mm obstructing renal stone within the distal left ureter with mild left-sided hydronephrosis and hydroureter.  He has some perinephric inflammatory fat stranding.  Patient has no signs of urinary tract infection but he does state he is prone to UTIs.  Given the inflammatory stranding, will plan to cover him with antibiotics.  Patient with no known drug allergies.  Discussed results with patient.  He is resting comfortably with no signs of distress.  He is hemodynamically stable.  Given the slight leukocytosis and perinephric inflammatory fat stranding, will plan to start him on Keflex.  Patient instructed on following up with urology.  Will give short course of pain medication.  Patient instructed to follow-up with urology.  Patient instructed that he will need to have his creatinine rechecked.  At this time, patient is able to tolerate p.o., is hemodynamically stable with no signs of distress.  He is stable for discharge home.  Portions of this note were generated with Scientist, clinical (histocompatibility and immunogenetics). Dictation errors may occur despite best attempts at proofreading.   Final Clinical Impression(s) / ED Diagnoses Final diagnoses:  Kidney stone    Rx / DC  Orders ED Discharge Orders         Ordered    cephALEXin (KEFLEX) 500 MG capsule  4 times daily     02/29/20 1851    oxyCODONE-acetaminophen (PERCOCET/ROXICET) 5-325 MG tablet  Every 8 hours  PRN     02/29/20 1851    tamsulosin (FLOMAX) 0.4 MG CAPS capsule  Daily     02/29/20 1851    ondansetron (ZOFRAN ODT) 4 MG disintegrating tablet  Every 8 hours PRN     02/29/20 1851           Maxwell Caul, PA-C 02/29/20 1925    Mancel Bale, MD 03/01/20 1737

## 2020-02-29 NOTE — Discharge Instructions (Addendum)
You can take Tylenol or Ibuprofen as directed for pain. You can alternate Tylenol and Ibuprofen every 4 hours. If you take Tylenol at 1pm, then you can take Ibuprofen at 5pm. Then you can take Tylenol again at 9pm.   Take pain medications as directed for break through pain. Do not drive or operate machinery while taking this medication.   Take flomax as directed.   Given your UTI history, we will plan to put you on antibiotics to prevent any infection. Take antibiotics as directed. Please take all of your antibiotics until finished.  As we discussed, your kidney function was slightly high here in the ED.  This could be due to the kidney stone.  This needs to be rechecked in about a week.  Please follow-up with the referred to urology doctor.  Return the emergency department for any worsening pain, high fever, vomiting or any other worsening or concerning symptoms.

## 2020-03-13 ENCOUNTER — Encounter: Payer: Self-pay | Admitting: Urology

## 2020-03-13 ENCOUNTER — Other Ambulatory Visit: Payer: Self-pay

## 2020-03-13 ENCOUNTER — Ambulatory Visit: Payer: Managed Care, Other (non HMO) | Admitting: Urology

## 2020-03-13 VITALS — BP 136/79 | HR 64 | Temp 97.5°F | Ht 70.0 in | Wt 285.0 lb

## 2020-03-13 DIAGNOSIS — N200 Calculus of kidney: Secondary | ICD-10-CM

## 2020-03-13 LAB — POCT URINALYSIS DIPSTICK
Bilirubin, UA: NEGATIVE
Blood, UA: NEGATIVE
Glucose, UA: NEGATIVE
Nitrite, UA: NEGATIVE
Protein, UA: NEGATIVE
Spec Grav, UA: 1.025 (ref 1.010–1.025)
Urobilinogen, UA: 0.2 E.U./dL
pH, UA: 5 (ref 5.0–8.0)

## 2020-03-13 NOTE — Progress Notes (Signed)
03/13/2020 11:47 AM   Perry Huffman 1976/02/28 [id number]  Referring provider: Merlyn Albert, MD [address] [place],  [place] [address]  nephrolithiasis  HPI: Mr Perry Huffman is a 44yo here for evaluation of nephrolithiasis. He presented to the ER on 5/20 with severe left flank pain. He was diangosed with a 80mm left UVJ calculus and passed it the next day. This is his second stone event. No family hx of nephrolithiasis. No flank pain. No LUTS. Urine pH is 5.0   PMH: Past Medical History:  Diagnosis Date  . Anxiety   . Asthma   . Heart burn   . High cholesterol   . Hypertension   . Seizure [facility])     Surgical History: Past Surgical History:  Procedure Laterality Date  . TYMPANOSTOMY TUBE PLACEMENT Bilateral 1980's    Home Medications:  Allergies as of 03/13/2020   No Known Allergies     Medication List       Accurate as of March 13, 2020 11:47 AM. If you have any questions, ask your nurse or doctor.        escitalopram 10 MG tablet Commonly known as: LEXAPRO TAKE 1 TABLET(10 MG) BY MOUTH DAILY What changed:   how much to take  how to take this  when to take this  additional instructions   ondansetron 4 MG disintegrating tablet Commonly known as: Zofran ODT Take 1 tablet (4 mg total) by mouth every 8 (eight) hours as needed for nausea or vomiting.   oxyCODONE-acetaminophen 5-325 MG tablet Commonly known as: PERCOCET/ROXICET Take 1-2 tablets by mouth every 8 (eight) hours as needed for severe pain.   pravastatin 40 MG tablet Commonly known as: PRAVACHOL Take 1 tablet (40 mg total) by mouth daily. What changed: when to take this       Allergies: No Known Allergies  Family History: History reviewed. No pertinent family history.  Social History:  reports that he has never smoked. He has never used smokeless tobacco. He reports current alcohol use. He reports that he does not use drugs.  ROS: All other review of systems were reviewed and  are negative except what is noted above in HPI  Physical Exam: BP 136/79   Pulse 64   Temp (!) 97.5 F (36.4 C)   Ht 5\' 10"  (1.778 m)   Wt 285 lb (129.3 kg)   BMI 40.89 kg/m   Constitutional:  Alert and oriented, No acute distress. HEENT: [place] AT, moist mucus membranes.  Trachea midline, no masses. Cardiovascular: No clubbing, cyanosis, or edema. Respiratory: Normal respiratory effort, no increased work of breathing. GI: Abdomen is soft, nontender, nondistended, no abdominal masses GU: No CVA tenderness.  Lymph: No cervical or inguinal lymphadenopathy. Skin: No rashes, bruises or suspicious lesions. Neurologic: Grossly intact, no focal deficits, moving all 4 extremities. Psychiatric: Normal mood and affect.  Laboratory Data: Lab Results  Component Value Date   WBC 12.2 (H) 02/29/2020   HGB 16.2 02/29/2020   HCT 49.1 02/29/2020   MCV 93.2 02/29/2020   PLT 242 02/29/2020    Lab Results  Component Value Date   CREATININE 1.64 (H) 02/29/2020    No results found for: PSA  No results found for: TESTOSTERONE  No results found for: HGBA1C  Urinalysis    Component Value Date/Time   COLORURINE YELLOW 02/29/2020 1600   APPEARANCEUR HAZY (A) 02/29/2020 1600   LABSPEC 1.032 (H) 02/29/2020 1600   PHURINE 5.0 02/29/2020 1600   GLUCOSEU NEGATIVE  02/29/2020 1600   HGBUR LARGE (A) 02/29/2020 1600   BILIRUBINUR neg 03/13/2020 1140   KETONESUR 5 (A) 02/29/2020 1600   PROTEINUR Negative 03/13/2020 1140   PROTEINUR 30 (A) 02/29/2020 1600   UROBILINOGEN 0.2 03/13/2020 1140   NITRITE neg 03/13/2020 1140   NITRITE NEGATIVE 02/29/2020 1600   LEUKOCYTESUR Trace (A) 03/13/2020 1140   LEUKOCYTESUR NEGATIVE 02/29/2020 1600    Lab Results  Component Value Date   BACTERIA NONE SEEN 02/29/2020    Pertinent Imaging: CT stone study 02/29/2020: Images reviewed and discussed with the patient No results found for this or any previous visit. No results found for this or any previous  visit. No results found for this or any previous visit. No results found for this or any previous visit. Results for orders placed during the hospital encounter of 09/12/13  US Renal   Narrative CLINICAL DATA:  Renal calculus  EXAM: RENAL/URINARY TRACT ULTRASOUND COMPLETE  COMPARISON:  None.  FINDINGS: Right Kidney:  Length: 11.3 cm. Normal cortical thickness and echogenicity. No mass, hydronephrosis or shadowing calcification.  Left Kidney:  Length: 11.0 cm. Normal cortical thickness and echogenicity. No mass, hydronephrosis or shadowing calcification.  Bladder:  Partially distended, grossly unremarkable. Ureteral jets were not visualized during imaging.  Incidentally noted increased echogenicity of the liver, question fatty infiltration though this can be seen with cirrhosis uncertain infiltrative disorders. Limited visualized portions of the liver show no gross nodularity.  IMPRESSION: Normal renal ultrasound.  Question fatty infiltration of liver as above.   Electronically Signed   By: Lavonia Dana M.D.   On: 09/12/2013 10:57    No results found for this or any previous visit. No results found for this or any previous visit. Results for orders placed during the hospital encounter of 02/29/20  CT Renal Stone Study   Narrative CLINICAL DATA:  Left flank pain.  EXAM: CT ABDOMEN AND PELVIS WITHOUT CONTRAST  TECHNIQUE: Multidetector CT imaging of the abdomen and pelvis was performed following the standard protocol without IV contrast.  COMPARISON:  None.  FINDINGS: Lower chest: No acute abnormality.  Hepatobiliary: No focal liver abnormality is seen. Diffuse fatty infiltration of the liver parenchyma is seen. No gallstones, gallbladder wall thickening, or biliary dilatation.  Pancreas: Unremarkable. No pancreatic ductal dilatation or surrounding inflammatory changes.  Spleen: Normal in size without focal abnormality.  Adrenals/Urinary Tract:  Adrenal glands are unremarkable. Kidneys are normal in size, without focal lesions. A 3 mm obstructing renal stone is seen within the distal left ureter with mild left-sided hydronephrosis, hydroureter and perinephric inflammatory fat stranding. Bladder is unremarkable.  Stomach/Bowel: Stomach is within normal limits. Appendix appears normal. No evidence of bowel wall thickening, distention, or inflammatory changes.  Vascular/Lymphatic: No significant vascular findings are present. No enlarged abdominal or pelvic lymph nodes.  Reproductive: Prostate is unremarkable.  Other: No abdominal wall hernia or abnormality. No abdominopelvic ascites.  Musculoskeletal: No acute or significant osseous findings.  IMPRESSION: 1. 3 mm obstructing renal stone within the distal left ureter with mild left-sided hydronephrosis, hydroureter and perinephric inflammatory fat stranding. 2. Hepatic steatosis.   Electronically Signed   By: Virgina Norfolk M.D.   On: 02/29/2020 18:32     Assessment & Plan:    1. Kidney stones -dietary handout given -increase water intake.  -RTC 1 year with KUB and renal US - POCT urinalysis dipstick   No follow-ups on file.  Nicolette Bang, MD  [facility] [place]

## 2020-03-13 NOTE — Patient Instructions (Signed)
Dietary Guidelines to Help Prevent Kidney Stones Kidney stones are deposits of minerals and salts that form inside your kidneys. Your risk of developing kidney stones may be greater depending on your diet, your lifestyle, the medicines you take, and whether you have certain medical conditions. Most people can reduce their chances of developing kidney stones by following the instructions below. Depending on your overall health and the type of kidney stones you tend to develop, your dietitian may give you more specific instructions. What are tips for following this plan? Reading food labels  Choose foods with "no salt added" or "low-salt" labels. Limit your sodium intake to less than 1500 mg per day.  Choose foods with calcium for each meal and snack. Try to eat about 300 mg of calcium at each meal. Foods that contain 200-500 mg of calcium per serving include: ? 8 oz (237 ml) of milk, fortified nondairy milk, and fortified fruit juice. ? 8 oz (237 ml) of kefir, yogurt, and soy yogurt. ? 4 oz (118 ml) of tofu. ? 1 oz of cheese. ? 1 cup (300 g) of dried figs. ? 1 cup (91 g) of cooked broccoli. ? 1-3 oz can of sardines or mackerel.  Most people need 1000 to 1500 mg of calcium each day. Talk to your dietitian about how much calcium is recommended for you. Shopping  Buy plenty of fresh fruits and vegetables. Most people do not need to avoid fruits and vegetables, even if they contain nutrients that may contribute to kidney stones.  When shopping for convenience foods, choose: ? Whole pieces of fruit. ? Premade salads with dressing on the side. ? Low-fat fruit and yogurt smoothies.  Avoid buying frozen meals or prepared deli foods.  Look for foods with live cultures, such as yogurt and kefir. Cooking  Do not add salt to food when cooking. Place a salt shaker on the table and allow each person to add his or her own salt to taste.  Use vegetable protein, such as beans, textured vegetable  protein (TVP), or tofu instead of meat in pasta, casseroles, and soups. Meal planning   Eat less salt, if told by your dietitian. To do this: ? Avoid eating processed or premade food. ? Avoid eating fast food.  Eat less animal protein, including cheese, meat, poultry, or fish, if told by your dietitian. To do this: ? Limit the number of times you have meat, poultry, fish, or cheese each week. Eat a diet free of meat at least 2 days a week. ? Eat only one serving each day of meat, poultry, fish, or seafood. ? When you prepare animal protein, cut pieces into small portion sizes. For most meat and fish, one serving is about the size of one deck of cards.  Eat at least 5 servings of fresh fruits and vegetables each day. To do this: ? Keep fruits and vegetables on hand for snacks. ? Eat 1 piece of fruit or a handful of berries with breakfast. ? Have a salad and fruit at lunch. ? Have two kinds of vegetables at dinner.  Limit foods that are high in a substance called oxalate. These include: ? Spinach. ? Rhubarb. ? Beets. ? Potato chips and french fries. ? Nuts.  If you regularly take a diuretic medicine, make sure to eat at least 1-2 fruits or vegetables high in potassium each day. These include: ? Avocado. ? Banana. ? Orange, prune, carrot, or tomato juice. ? Baked potato. ? Cabbage. ? Beans and split   peas. General instructions   Drink enough fluid to keep your urine clear or pale yellow. This is the most important thing you can do.  Talk to your health care provider and dietitian about taking daily supplements. Depending on your health and the cause of your kidney stones, you may be advised: ? Not to take supplements with vitamin C. ? To take a calcium supplement. ? To take a daily probiotic supplement. ? To take other supplements such as magnesium, fish oil, or vitamin B6.  Take all medicines and supplements as told by your health care provider.  Limit alcohol intake to no  more than 1 drink a day for nonpregnant women and 2 drinks a day for men. One drink equals 12 oz of beer, 5 oz of wine, or 1 oz of hard liquor.  Lose weight if told by your health care provider. Work with your dietitian to find strategies and an eating plan that works best for you. What foods are not recommended? Limit your intake of the following foods, or as told by your dietitian. Talk to your dietitian about specific foods you should avoid based on the type of kidney stones and your overall health. Grains Breads. Bagels. Rolls. Baked goods. Salted crackers. Cereal. Pasta. Vegetables Spinach. Rhubarb. Beets. Canned vegetables. Pickles. Olives. Meats and other protein foods Nuts. Nut butters. Large portions of meat, poultry, or fish. Salted or cured meats. Deli meats. Hot dogs. Sausages. Dairy Cheese. Beverages Regular soft drinks. Regular vegetable juice. Seasonings and other foods Seasoning blends with salt. Salad dressings. Canned soups. Soy sauce. Ketchup. Barbecue sauce. Canned pasta sauce. Casseroles. Pizza. Lasagna. Frozen meals. Potato chips. French fries. Summary  You can reduce your risk of kidney stones by making changes to your diet.  The most important thing you can do is drink enough fluid. You should drink enough fluid to keep your urine clear or pale yellow.  Ask your health care provider or dietitian how much protein from animal sources you should eat each day, and also how much salt and calcium you should have each day. This information is not intended to replace advice given to you by your health care provider. Make sure you discuss any questions you have with your health care provider. Document Revised: 01/18/2019 Document Reviewed: 09/08/2016 Elsevier Patient Education  2020 [employer]vier Inc.  

## 2020-03-13 NOTE — Progress Notes (Signed)
Urological Symptom Review  Patient is experiencing the following symptoms: Kidney stones (already passed it)   Review of Systems  Gastrointestinal (upper)  : Negative for upper GI symptoms  Gastrointestinal (lower) : Negative for lower GI symptoms  Constitutional : Negative for symptoms  Skin: Negative for skin symptoms  Eyes: Negative for eye symptoms  Ear/Nose/Throat : Negative for Ear/Nose/Throat symptoms  Hematologic/Lymphatic: Negative for Hematologic/Lymphatic symptoms  Cardiovascular : Negative for cardiovascular symptoms  Respiratory : Negative for respiratory symptoms  Endocrine: Negative for endocrine symptoms  Musculoskeletal: Negative for musculoskeletal symptoms  Neurological: Negative for neurological symptoms  Psychologic: Negative for psychiatric symptoms

## 2020-04-23 ENCOUNTER — Other Ambulatory Visit: Payer: Self-pay | Admitting: *Deleted

## 2020-04-23 MED ORDER — KETOCONAZOLE 2 % EX CREA: 1.0000 "application " | TOPICAL_CREAM | Freq: Two times a day (BID) | CUTANEOUS | 8 refills | Status: AC

## 2020-04-23 NOTE — Telephone Encounter (Signed)
Received request from [facility] wanting to Change drug Rx Ecoza 1% Foam because not covered under patient plan.  Okay per [place] to change to ketoconazole: new prescription sent to [facility].

## 2020-05-07 ENCOUNTER — Telehealth: Payer: Self-pay | Admitting: Family Medicine

## 2020-05-07 MED ORDER — PRAVASTATIN SODIUM 40 MG PO TABS: 40.0000 mg | ORAL_TABLET | Freq: Every day | ORAL | 1 refills | Status: DC

## 2020-05-07 NOTE — Telephone Encounter (Signed)
Pt has appt on 8/30 to establish care. Pt is needing refill on pravastatin (PRAVACHOL) 40 MG tablet   [id number] - [place], [place] - [address] AT [facility] [address]  Pt would also like to know if lab work is needed.

## 2020-06-10 ENCOUNTER — Ambulatory Visit: Payer: Managed Care, Other (non HMO) | Admitting: Family Medicine

## 2020-06-10 ENCOUNTER — Other Ambulatory Visit: Payer: Self-pay

## 2020-06-10 ENCOUNTER — Encounter: Payer: Self-pay | Admitting: Family Medicine

## 2020-06-10 VITALS — BP 122/84 | HR 85 | Temp 97.6°F | Ht 70.0 in | Wt 284.0 lb

## 2020-06-10 DIAGNOSIS — F411 Generalized anxiety disorder: Secondary | ICD-10-CM | POA: Diagnosis not present

## 2020-06-10 DIAGNOSIS — E785 Hyperlipidemia, unspecified: Secondary | ICD-10-CM

## 2020-06-10 MED ORDER — ESCITALOPRAM OXALATE 10 MG PO TABS: ORAL_TABLET | ORAL | 5 refills | Status: DC

## 2020-06-10 NOTE — Progress Notes (Signed)
Patient ID: Perry Huffman, male    DOB: Apr 23, 1976, 45 y.o.   MRN: [id number]   Chief Complaint  Patient presents with  . Hyperlipidemia   Subjective:    HPI  Pt arrives to establish care.  Doing well and f/u for depression and HLD.  HLD- doing well no new concerns.  Compliant with meds. No chest pain, palpitations, myalgias or joint pains.  Pt doing well with lexapro 66m and was on 211mand made him feel sleepy and groggy. Doing well with this dose.  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  Currently on night shift and not getting good sleep. But feeling medication working well and not wanting to increase or change at this time.  Had 2nd shot for covid.  Last Tuesday.   Medical History [place] a past medical history of Anxiety, Asthma, Heart burn, High cholesterol, Hypertension, and [place]   Outpatient Encounter Medications as of 06/10/2020  Medication Sig  . escitalopram (LEXAPRO) 10 MG tablet TAKE 1 TABLET(10 MG) BY MOUTH DAILY  . pravastatin (PRAVACHOL) 40 MG tablet Take 1 tablet (40 mg total) by mouth daily.  . [DISCONTINUED] escitalopram (LEXAPRO) 10 MG tablet TAKE 1 TABLET(10 MG) BY MOUTH DAILY (Patient taking differently: Take 10 mg by mouth in the morning. )  . [DISCONTINUED] ondansetron (ZOFRAN ODT) 4 MG disintegrating tablet Take 1 tablet (4 mg total) by mouth every 8 (eight) hours as needed for nausea or vomiting. (Patient not taking: Reported on 03/13/2020)  . [DISCONTINUED] oxyCODONE-acetaminophen (PERCOCET/ROXICET) 5-325 MG tablet Take 1-2 tablets by mouth every 8 (eight) hours as needed for severe pain. (Patient not taking: Reported on 03/13/2020)   No facility-administered encounter medications on file as of 06/10/2020.     Review of Systems  Constitutional: Negative for chills and fever.  HENT: Negative for congestion, rhinorrhea and sore throat.   Respiratory: Negative for cough, shortness of breath and wheezing.   Cardiovascular:  Negative for chest pain and leg swelling.  Gastrointestinal: Negative for abdominal pain, diarrhea, nausea and vomiting.  Genitourinary: Negative for dysuria and frequency.  Skin: Negative for rash.  Neurological: Negative for dizziness, weakness and headaches.     Vitals BP 122/84   Pulse 85   Temp 97.6 F (36.4 C)   Ht _0  (1.778 m)   Wt 284 lb (128.8 kg)   SpO2 97%   BMI 40.75 kg/m   Objective:   Physical Exam Vitals and nursing note reviewed.  Constitutional:      General: He is not in acute distress.    Appearance: Normal appearance. He is not ill-appearing.  HENT:     Head: Normocephalic.     Nose: Nose normal. No congestion.     Mouth/Throat:     Mouth: Mucous membranes are moist.     Pharynx: No oropharyngeal exudate.  Eyes:     Extraocular Movements: Extraocular movements intact.     Conjunctiva/sclera: Conjunctivae normal.     Pupils: Pupils are equal, round, and reactive to light.  Cardiovascular:     Rate and Rhythm: Normal rate and regular rhythm.     Pulses: Normal pulses.     Heart sounds: Normal heart sounds. No murmur heard.   Pulmonary:     Effort: Pulmonary effort is normal.     Breath sounds: Normal breath sounds. No wheezing, rhonchi or rales.  Musculoskeletal:        General: Normal range of motion.     Right lower  leg: No edema.     Left lower leg: No edema.  Skin:    General: Skin is warm and dry.     Findings: No rash.  Neurological:     General: No focal deficit present.     Mental Status: He is alert and oriented to person, place, and time.     Cranial Nerves: No cranial nerve deficit.  Psychiatric:        Mood and Affect: Mood normal.        Behavior: Behavior normal.        Thought Content: Thought content normal.        Judgment: Judgment normal.      Assessment and Plan   1. Hyperlipidemia LDL goal <130 - CBC - CMP14+EGFR - Lipid panel  2. Generalized anxiety disorder - escitalopram (LEXAPRO) 10 MG tablet; TAKE  1 TABLET(10 MG) BY MOUTH DAILY  Dispense: 30 tablet; Refill: 5    Fasting labs this week.   F/u [address] prn.

## 2020-06-14 LAB — CMP14+EGFR
ALT: 45 IU/L — ABNORMAL HIGH (ref 0–44)
AST: 26 IU/L (ref 0–40)
Albumin/Globulin Ratio: 1.8 (ref 1.2–2.2)
Albumin: 4.5 g/dL (ref 4.0–5.0)
Alkaline Phosphatase: 98 IU/L (ref 48–121)
BUN/Creatinine Ratio: 13 (ref 9–20)
BUN: 15 mg/dL (ref 6–24)
Bilirubin Total: 0.7 mg/dL (ref 0.0–1.2)
CO2: 24 mmol/L (ref 20–29)
Calcium: 9.5 mg/dL (ref 8.7–10.2)
Chloride: 102 mmol/L (ref 96–106)
Creatinine, Ser: 1.18 mg/dL (ref 0.76–1.27)
GFR calc Af Amer: 87 mL/min/{1.73_m2} (ref >59)
GFR calc non Af Amer: 75 mL/min/{1.73_m2} (ref >59)
Globulin, Total: 2.5 g/dL (ref 1.5–4.5)
Glucose: 91 mg/dL (ref 65–99)
Potassium: 4.4 mmol/L (ref 3.5–5.2)
Sodium: 141 mmol/L (ref 134–144)
Total Protein: 7 g/dL (ref 6.0–8.5)

## 2020-06-14 LAB — CBC
Hematocrit: 47.4 % (ref 37.5–51.0)
Hemoglobin: 16.2 g/dL (ref 13.0–17.7)
MCH: 31.3 pg (ref 26.6–33.0)
MCHC: 34.2 g/dL (ref 31.5–35.7)
MCV: 92 fL (ref 79–97)
Platelets: 225 10*3/uL (ref 150–450)
RBC: 5.18 x10E6/uL (ref 4.14–5.80)
RDW: 12.6 % (ref 11.6–15.4)
WBC: 7.8 10*3/uL (ref 3.4–10.8)

## 2020-06-14 LAB — LIPID PANEL
Chol/HDL Ratio: 4.9 ratio (ref 0.0–5.0)
Cholesterol, Total: 214 mg/dL — ABNORMAL HIGH (ref 100–199)
HDL: 44 mg/dL (ref >39)
LDL Chol Calc (NIH): 148 mg/dL — ABNORMAL HIGH (ref 0–99)
Triglycerides: 122 mg/dL (ref 0–149)
VLDL Cholesterol Cal: 22 mg/dL (ref 5–40)

## 2020-09-21 ENCOUNTER — Ambulatory Visit
Admission: EM | Admit: 2020-09-21 | Discharge: 2020-09-21 | Disposition: A | Discharge status: Home / Self Care | Payer: Managed Care, Other (non HMO) | Attending: Internal Medicine | Admitting: Internal Medicine

## 2020-09-21 ENCOUNTER — Other Ambulatory Visit: Payer: Self-pay

## 2020-09-21 DIAGNOSIS — R059 Cough, unspecified: Secondary | ICD-10-CM | POA: Diagnosis present

## 2020-09-21 LAB — POCT RAPID STREP A (OFFICE): Rapid Strep A Screen: NEGATIVE

## 2020-09-21 NOTE — ED Triage Notes (Signed)
Pt presents with c/o sore throat and headache  That began last night

## 2020-09-24 NOTE — ED Provider Notes (Signed)
[facility]    CSN: [id number] Arrival date & time: 09/21/20  1447      History   Chief Complaint Chief Complaint  Patient presents with  . Sore Throat  . Cough    HPI Perry Huffman is a 44 y.o. male comes to the urgent care with sore throat and a headache that began last night.  Onset was abrupt.  Patient has some nasal congestion but no cough, shortness of breath or wheezing.  No fever or chills.  Headache is global, throbbing and no known relieving factors.  No known aggravating factors.  Patient denies any sick contacts. HPI  Past Medical History:  Diagnosis Date  . Anxiety   . Asthma   . Heart burn   . High cholesterol   . Hypertension   . Seizure [facility])     Patient Active Problem List   Diagnosis Date Noted  . Kidney stones 03/13/2020  . Chest pain 12/03/2013  . Generalized anxiety disorder 12/03/2013  . Hyperlipidemia LDL goal <130 01/10/2013  . MUSCLE STRAIN, LEFT CALF 07/02/2008    Past Surgical History:  Procedure Laterality Date  . TYMPANOSTOMY TUBE PLACEMENT Bilateral 1980's       Home Medications    Prior to Admission medications   Medication Sig Start Date End Date Taking? Authorizing Provider  escitalopram (LEXAPRO) 10 MG tablet TAKE 1 TABLET(10 MG) BY MOUTH DAILY 06/10/20   Ladona Ridgel, Malena M, DO  pravastatin (PRAVACHOL) 40 MG tablet Take 1 tablet (40 mg total) by mouth daily. 05/07/20   Annalee Genta, DO    Family History Family History  Family history unknown: Yes    Social History Social History   Tobacco Use  . Smoking status: Never Smoker  . Smokeless tobacco: Never Used  Substance Use Topics  . Alcohol use: Yes    Comment: occ.   . Drug use: Never     Allergies   Patient has no known allergies.   Review of Systems Review of Systems  Constitutional: Negative.   HENT: Positive for congestion and sore throat.   Respiratory: Positive for cough. Negative for shortness of breath and wheezing.    Gastrointestinal: Negative.   Musculoskeletal: Negative.   Neurological: Positive for headaches.     Physical Exam Triage Vital Signs ED Triage Vitals  Enc Vitals Group     BP 09/21/20 1654 135/80     Pulse Rate 09/21/20 1654 80     Resp 09/21/20 1654 18     Temp 09/21/20 1654 98.9 F (37.2 C)     Temp src --      SpO2 09/21/20 1654 98 %     Weight --      Height --      Head Circumference --      Peak Flow --      Pain Score 09/21/20 1606 4     Pain Loc --      Pain Edu? --      Excl. in GC? --    No data found.  Updated Vital Signs BP 135/80   Pulse 80   Temp 98.9 F (37.2 C)   Resp 18   SpO2 98%   Visual Acuity Right Eye Distance:   Left Eye Distance:   Bilateral Distance:    Right Eye Near:   Left Eye Near:    Bilateral Near:     Physical Exam Vitals reviewed.  Constitutional:      General: He is not  in acute distress.    Appearance: He is not ill-appearing.  HENT:     Right Ear: Tympanic membrane normal.     Left Ear: Tympanic membrane normal.     Mouth/Throat:     Mouth: Mucous membranes are moist.     Pharynx: Posterior oropharyngeal erythema present.     Tonsils: No tonsillar exudate. 0 on the right. 0 on the left.  Cardiovascular:     Rate and Rhythm: Normal rate and regular rhythm.     Heart sounds: Normal heart sounds.  Pulmonary:     Effort: Pulmonary effort is normal.     Breath sounds: Normal breath sounds.  Neurological:     Mental Status: He is alert.      UC Treatments / Results  Labs (all labs ordered are listed, but only abnormal results are displayed) Labs Reviewed  CULTURE, GROUP A STREP ([facility])  COVID-19, FLU A+B NAA  POCT RAPID STREP A (OFFICE)    EKG   Radiology No results found.  Procedures Procedures (including critical care time)  Medications Ordered in UC Medications - No data to display  Initial Impression / Assessment and Plan / UC Course  I have reviewed the triage vital signs and the nursing  notes.  Pertinent labs & imaging results that were available during my care of the patient were reviewed by me and considered in my medical decision making (see chart for details).     1.  Acute viral pharyngitis: Point-of-care strep is negative Throat culture sent COVID-19/flu A+ B PCR has been sent Patient is advised to quarantine until COVID-19 test results are available Warm salt water gargle Tylenol/Motrin as needed for pain Cepacol lozenges as needed We will call the patient if labs are significant. Final Clinical Impressions(s) / UC Diagnoses   Final diagnoses:  Cough   Discharge Instructions   None    ED Prescriptions    None     PDMP not reviewed this encounter.   Merrilee Jansky, MD 09/24/20 [phone]

## 2020-09-25 LAB — COVID-19, FLU A+B NAA
Influenza A, NAA: NOT DETECTED
Influenza B, NAA: NOT DETECTED
SARS-CoV-2, NAA: NOT DETECTED

## 2020-09-25 LAB — CULTURE, GROUP A STREP (THRC)

## 2020-10-08 ENCOUNTER — Other Ambulatory Visit: Payer: Self-pay

## 2020-10-08 ENCOUNTER — Ambulatory Visit
Admission: EM | Admit: 2020-10-08 | Discharge: 2020-10-08 | Disposition: A | Discharge status: Home / Self Care | Payer: Managed Care, Other (non HMO) | Attending: Physician Assistant | Admitting: Physician Assistant

## 2020-10-08 DIAGNOSIS — J069 Acute upper respiratory infection, unspecified: Secondary | ICD-10-CM

## 2020-10-08 NOTE — Discharge Instructions (Signed)
Covid and Influenza are pending 

## 2020-10-08 NOTE — ED Provider Notes (Signed)
[facility]    CSN: [id number] Arrival date & time: 10/08/20  5176      History   Chief Complaint No chief complaint on file.   HPI Perry Huffman is a 44 y.o. male.   The history is provided by the patient. No language interpreter was used.  Cough Cough characteristics:  Non-productive Sputum characteristics:  Nondescript Severity:  Mild Timing:  Constant Progression:  Worsening Chronicity:  New Worsened by:  Nothing Ineffective treatments:  None tried Associated symptoms: fever   Associated symptoms: no shortness of breath     Past Medical History:  Diagnosis Date  . Anxiety   . Asthma   . Heart burn   . High cholesterol   . Hypertension   . Seizure [facility])     Patient Active Problem List   Diagnosis Date Noted  . Kidney stones 03/13/2020  . Chest pain 12/03/2013  . Generalized anxiety disorder 12/03/2013  . Hyperlipidemia LDL goal <130 01/10/2013  . MUSCLE STRAIN, LEFT CALF 07/02/2008    Past Surgical History:  Procedure Laterality Date  . TYMPANOSTOMY TUBE PLACEMENT Bilateral 1980's       Home Medications    Prior to Admission medications   Medication Sig Start Date End Date Taking? Authorizing Provider  escitalopram (LEXAPRO) 10 MG tablet TAKE 1 TABLET(10 MG) BY MOUTH DAILY 06/10/20   Ladona Ridgel, Malena M, DO  pravastatin (PRAVACHOL) 40 MG tablet Take 1 tablet (40 mg total) by mouth daily. 05/07/20   Annalee Genta, DO    Family History Family History  Family history unknown: Yes    Social History Social History   Tobacco Use  . Smoking status: Never Smoker  . Smokeless tobacco: Never Used  Substance Use Topics  . Alcohol use: Yes    Comment: occ.   . Drug use: Never     Allergies   Patient has no known allergies.   Review of Systems Review of Systems  Constitutional: Positive for fever.  Respiratory: Positive for cough. Negative for shortness of breath.   All other systems reviewed and are  negative.    Physical Exam Triage Vital Signs ED Triage Vitals [10/08/20 0820]  Enc Vitals Group     BP 131/84     Pulse Rate 85     Resp 15     Temp 99.3 F (37.4 C)     Temp src      SpO2 97 %     Weight      Height      Head Circumference      Peak Flow      Pain Score      Pain Loc      Pain Edu?      Excl. in GC?    No data found.  Updated Vital Signs BP 131/84   Pulse 85   Temp 99.3 F (37.4 C)   Resp 15   SpO2 97%   Visual Acuity Right Eye Distance:   Left Eye Distance:   Bilateral Distance:    Right Eye Near:   Left Eye Near:    Bilateral Near:     Physical Exam Vitals and nursing note reviewed.  Constitutional:      Appearance: He is well-developed and well-nourished.  HENT:     Head: Normocephalic.     Nose: Nose normal.  Eyes:     Extraocular Movements: EOM normal.  Cardiovascular:     Rate and Rhythm: Normal rate.  Pulses: Normal pulses.  Pulmonary:     Effort: Pulmonary effort is normal.  Abdominal:     General: There is no distension.  Musculoskeletal:        General: Normal range of motion.     Cervical back: Normal range of motion.  Neurological:     General: No focal deficit present.     Mental Status: He is alert and oriented to person, place, and time.  Psychiatric:        Mood and Affect: Mood and affect and mood normal.      UC Treatments / Results  Labs (all labs ordered are listed, but only abnormal results are displayed) Labs Reviewed  COVID-19, FLU A+B NAA    EKG   Radiology No results found.  Procedures Procedures (including critical care time)  Medications Ordered in UC Medications - No data to display  Initial Impression / Assessment and Plan / UC Course  I have reviewed the triage vital signs and the nursing notes.  Pertinent labs & imaging results that were available during my care of the patient were reviewed by me and considered in my medical decision making (see chart for details).      COVid and flu test are pending Final Clinical Impressions(s) / UC Diagnoses   Final diagnoses:  Acute upper respiratory infection     Discharge Instructions     Covid and Influenza are pending   ED Prescriptions    None     PDMP not reviewed this encounter.  An After Visit Summary was printed and given to the patient.    Elson Areas, [place] 10/08/20 [phone]

## 2020-10-08 NOTE — ED Triage Notes (Signed)
intake completed by provider

## 2020-10-10 LAB — COVID-19, FLU A+B NAA
Influenza A, NAA: NOT DETECTED
Influenza B, NAA: NOT DETECTED
SARS-CoV-2, NAA: DETECTED — AB

## 2020-11-05 ENCOUNTER — Other Ambulatory Visit: Payer: Self-pay

## 2020-11-05 ENCOUNTER — Encounter: Payer: Self-pay | Admitting: Family Medicine

## 2020-11-05 ENCOUNTER — Ambulatory Visit: Payer: Managed Care, Other (non HMO) | Admitting: Family Medicine

## 2020-11-05 VITALS — BP 128/86 | HR 94 | Temp 97.4°F | Wt 285.0 lb

## 2020-11-05 DIAGNOSIS — F411 Generalized anxiety disorder: Secondary | ICD-10-CM

## 2020-11-05 DIAGNOSIS — E785 Hyperlipidemia, unspecified: Secondary | ICD-10-CM | POA: Diagnosis not present

## 2020-11-05 DIAGNOSIS — Z23 Encounter for immunization: Secondary | ICD-10-CM | POA: Diagnosis not present

## 2020-11-05 MED ORDER — PRAVASTATIN SODIUM 40 MG PO TABS: 40.0000 mg | ORAL_TABLET | Freq: Every day | ORAL | 1 refills | Status: DC

## 2020-11-05 MED ORDER — ESCITALOPRAM OXALATE 10 MG PO TABS: ORAL_TABLET | ORAL | 5 refills | Status: DC

## 2020-11-05 NOTE — Progress Notes (Deleted)
   Patient ID: Perry Huffman, male    DOB: 07-Jan-1976, 45 y.o.   MRN: [id number]   Chief Complaint  Patient presents with  . Hyperlipidemia  . Anxiety   Subjective:    HPI   Medical History Devesh has a past medical history of Anxiety, Asthma, Heart burn, High cholesterol, Hypertension, and Seizure ([facility]).   Outpatient Encounter Medications as of 11/05/2020  Medication Sig  . escitalopram (LEXAPRO) 10 MG tablet TAKE 1 TABLET(10 MG) BY MOUTH DAILY  . pravastatin (PRAVACHOL) 40 MG tablet Take 1 tablet (40 mg total) by mouth daily.  . [DISCONTINUED] escitalopram (LEXAPRO) 10 MG tablet TAKE 1 TABLET(10 MG) BY MOUTH DAILY  . [DISCONTINUED] pravastatin (PRAVACHOL) 40 MG tablet Take 1 tablet (40 mg total) by mouth daily.   No facility-administered encounter medications on file as of 11/05/2020.     Review of Systems   Vitals BP 128/86   Pulse 94   Temp (!) 97.4 F (36.3 C)   Wt 285 lb (129.3 kg)   SpO2 99%   BMI 40.89 kg/m   Objective:   Physical Exam   Assessment and Plan   1. Need for diphtheria-tetanus-pertussis (Tdap) vaccine  2. Generalized anxiety disorder - escitalopram (LEXAPRO) 10 MG tablet; TAKE 1 TABLET(10 MG) BY MOUTH DAILY  Dispense: 30 tablet; Refill: 5

## 2020-11-05 NOTE — Progress Notes (Signed)
Patient ID: Perry Huffman, male    DOB: January 16, 1976, 45 y.o.   MRN: [id number]   Chief Complaint  Patient presents with  . Hyperlipidemia  . Anxiety   Subjective:    HPI  H/o covid illness- Pt had covid last month. Cough lingered but didn't have much symptoms. Didn't get the plasma infusion. No sob or other issues.  Pt improved.   Anxiety- doing well.  Taking lexapro.  Controlling symptoms.  HLD- doing well no new concerns.  Compliant with meds. No chest pain, palpitations, myalgias or joint pains.   Medical History Perry Huffman has a past medical history of Anxiety, Asthma, Heart burn, High cholesterol, Hypertension, and Seizure (HCC).   Outpatient Encounter Medications as of 11/05/2020  Medication Sig  . escitalopram (LEXAPRO) 10 MG tablet TAKE 1 TABLET(10 MG) BY MOUTH DAILY  . pravastatin (PRAVACHOL) 40 MG tablet Take 1 tablet (40 mg total) by mouth daily.  . [DISCONTINUED] escitalopram (LEXAPRO) 10 MG tablet TAKE 1 TABLET(10 MG) BY MOUTH DAILY  . [DISCONTINUED] pravastatin (PRAVACHOL) 40 MG tablet Take 1 tablet (40 mg total) by mouth daily.   No facility-administered encounter medications on file as of 11/05/2020.     Review of Systems  Constitutional: Negative for chills and fever.  HENT: Negative for congestion, rhinorrhea and sore throat.   Respiratory: Negative for cough, shortness of breath and wheezing.   Cardiovascular: Negative for chest pain and leg swelling.  Gastrointestinal: Negative for abdominal pain, diarrhea, nausea and vomiting.  Genitourinary: Negative for dysuria and frequency.  Skin: Negative for rash.  Neurological: Negative for dizziness, weakness and headaches.  Psychiatric/Behavioral: Negative for dysphoric mood, self-injury, sleep disturbance and suicidal ideas. The patient is not nervous/anxious.      Vitals BP 128/86   Pulse 94   Temp (!) 97.4 F (36.3 C)   Wt 285 lb (129.3 kg)   SpO2 99%   BMI 40.89 kg/m   Objective:   Physical  Exam Vitals and nursing note reviewed.  Constitutional:      General: He is not in acute distress.    Appearance: Normal appearance. He is not ill-appearing.  HENT:     Head: Normocephalic.     Nose: Nose normal. No congestion.     Mouth/Throat:     Mouth: Mucous membranes are moist.     Pharynx: No oropharyngeal exudate.  Eyes:     Extraocular Movements: Extraocular movements intact.     Conjunctiva/sclera: Conjunctivae normal.     Pupils: Pupils are equal, round, and reactive to light.  Cardiovascular:     Rate and Rhythm: Normal rate and regular rhythm.     Pulses: Normal pulses.     Heart sounds: Normal heart sounds. No murmur heard.   Pulmonary:     Effort: Pulmonary effort is normal.     Breath sounds: Normal breath sounds. No wheezing, rhonchi or rales.  Musculoskeletal:        General: Normal range of motion.     Right lower leg: No edema.     Left lower leg: No edema.  Skin:    General: Skin is warm and dry.     Findings: No rash.  Neurological:     General: No focal deficit present.     Mental Status: He is alert and oriented to person, place, and time.     Cranial Nerves: No cranial nerve deficit.  Psychiatric:        Mood and Affect: Mood normal.  Behavior: Behavior normal.        Thought Content: Thought content normal.        Judgment: Judgment normal.      Assessment and Plan   1. Generalized anxiety disorder - escitalopram (LEXAPRO) 10 MG tablet; TAKE 1 TABLET(10 MG) BY MOUTH DAILY  Dispense: 30 tablet; Refill: 5  2. Need for diphtheria-tetanus-pertussis (Tdap) vaccine - Tdap vaccine greater than or equal to 7yo IM  3. Hyperlipidemia LDL goal <130   GAD- stable. Cont lexapro.  hld- stable, suboptimal. Cont meds. Labs reviewed from 9/21. Will recheck on next visit.  Cont to eat low fat/cholesterol diet.  Fu 22mo or prn.

## 2020-11-17 ENCOUNTER — Encounter: Payer: Self-pay | Admitting: Family Medicine

## 2021-03-06 ENCOUNTER — Ambulatory Visit (HOSPITAL_COMMUNITY): Payer: Managed Care, Other (non HMO)

## 2021-03-06 ENCOUNTER — Telehealth: Payer: Self-pay

## 2021-03-06 NOTE — Telephone Encounter (Signed)
Patient had to cancel [facility] imaging for upcoming appointment due to insurance coverage. Wants to know if he will still need to come for June 1st visit?  Please advise.  Call back:  [phone]  Thanks, Rosey Bath

## 2021-03-07 NOTE — Telephone Encounter (Signed)
Patient called and canceled upcoming appointment per due to cost of imaging per front staff.

## 2021-03-11 ENCOUNTER — Encounter: Payer: Self-pay | Admitting: Physician Assistant

## 2021-03-11 ENCOUNTER — Other Ambulatory Visit: Payer: Self-pay

## 2021-03-11 ENCOUNTER — Ambulatory Visit: Payer: Managed Care, Other (non HMO) | Admitting: Physician Assistant

## 2021-03-11 DIAGNOSIS — B354 Tinea corporis: Secondary | ICD-10-CM | POA: Diagnosis not present

## 2021-03-11 DIAGNOSIS — Z85828 Personal history of other malignant neoplasm of skin: Secondary | ICD-10-CM

## 2021-03-11 MED ORDER — ECOZA 1 % EX FOAM: 1.0000 "application " | Freq: Every day | CUTANEOUS | 11 refills | Status: DC

## 2021-03-11 NOTE — Progress Notes (Signed)
   Follow-Up Visit   Subjective  Perry Huffman is a 45 y.o. male who presents for the following: New Patient (Initial Visit) (Patient here today for refills on his Ecoza Foam. Per chart review, he did test for a positive KOH originally. No personal history of atypical moles, melanoma or non mole skin cancer. Family history of non mole skin cancer, patient's father and mother. No family history of atypical moles, or melanoma. ).   The following portions of the chart were reviewed this encounter and updated as appropriate:  Tobacco  Allergies  Meds  Problems  Med Hx  Surg Hx  Fam Hx         Objective  Well appearing patient in no apparent distress; mood and affect are within normal limits.  All skin waist up examined.  Objective  Left Hip (side) - Posterior, Left Thigh - Posterior, Right Hip (side) - Posterior, Right Thigh - Posterior: Clear today. Flares when it gets hot.   Assessment & Plan  Tinea corporis (4) Left Hip (side) - Posterior; Right Hip (side) - Posterior; Left Thigh - Posterior; Right Thigh - Posterior  Econazole Nitrate (ECOZA) 1 % FOAM - Left Hip (side) - Posterior, Left Thigh - Posterior, Right Hip (side) - Posterior, Right Thigh - Posterior    I, Kresha Abelson, PA-C, have reviewed all documentation's for this visit.  The documentation on 03/11/21 for the exam, diagnosis, procedures and orders are all accurate and complete.

## 2021-03-12 ENCOUNTER — Ambulatory Visit: Payer: Managed Care, Other (non HMO) | Admitting: Urology

## 2021-03-13 ENCOUNTER — Ambulatory Visit: Payer: Managed Care, Other (non HMO) | Admitting: Urology

## 2021-05-15 ENCOUNTER — Ambulatory Visit: Payer: Managed Care, Other (non HMO) | Admitting: Family Medicine

## 2021-05-15 ENCOUNTER — Other Ambulatory Visit: Payer: Self-pay

## 2021-05-15 VITALS — BP 127/75 | HR 82 | Temp 97.3°F | Ht 70.0 in | Wt 286.0 lb

## 2021-05-15 DIAGNOSIS — E785 Hyperlipidemia, unspecified: Secondary | ICD-10-CM

## 2021-05-15 DIAGNOSIS — F411 Generalized anxiety disorder: Secondary | ICD-10-CM | POA: Diagnosis not present

## 2021-05-15 MED ORDER — ESCITALOPRAM OXALATE 10 MG PO TABS: ORAL_TABLET | ORAL | 5 refills | Status: DC

## 2021-05-15 MED ORDER — PRAVASTATIN SODIUM 40 MG PO TABS: 40.0000 mg | ORAL_TABLET | Freq: Every day | ORAL | 1 refills | Status: DC

## 2021-05-15 NOTE — Progress Notes (Signed)
Patient ID: Perry Huffman, male    DOB: 04/08/1976, 45 y.o.   MRN: [id number]   Chief Complaint  Patient presents with   Hyperlipidemia    Follow up- No problems   Subjective:    HPI Pt doing well with anxiety meds and cholesterol med.  HLD- doing well no new concerns.  Compliant with meds. No chest pain, palpitations, myalgias or joint pains.  Anxiety- taking lexapro.  Stable.  No new concerns.  Medical History Perry Huffman has a past medical history of Anxiety, Asthma, Heart burn, High cholesterol, Hypertension, and Seizure ([place]).   Outpatient Encounter Medications as of 05/15/2021  Medication Sig   Econazole Nitrate (ECOZA) 1 % FOAM Apply 1 application topically daily.   escitalopram (LEXAPRO) 10 MG tablet TAKE 1 TABLET(10 MG) BY MOUTH DAILY   pravastatin (PRAVACHOL) 40 MG tablet Take 1 tablet (40 mg total) by mouth daily.   [DISCONTINUED] escitalopram (LEXAPRO) 10 MG tablet TAKE 1 TABLET(10 MG) BY MOUTH DAILY   [DISCONTINUED] pravastatin (PRAVACHOL) 40 MG tablet Take 1 tablet (40 mg total) by mouth daily.   No facility-administered encounter medications on file as of 05/15/2021.     Review of Systems  Constitutional:  Negative for chills and fever.  HENT:  Negative for congestion, rhinorrhea and sore throat.   Respiratory:  Negative for cough, shortness of breath and wheezing.   Cardiovascular:  Negative for chest pain and leg swelling.  Gastrointestinal:  Negative for abdominal pain, diarrhea, nausea and vomiting.  Genitourinary:  Negative for dysuria and frequency.  Skin:  Negative for rash.  Neurological:  Negative for dizziness, weakness and headaches.  Psychiatric/Behavioral:  Negative for dysphoric mood and sleep disturbance. The patient is not nervous/anxious.     Vitals BP 127/75   Pulse 82   Temp (!) 97.3 F (36.3 C) (Oral)   Ht _0  (1.778 m)   Wt 286 lb (129.7 kg)   SpO2 97%   BMI 41.04 kg/m   Objective:   Physical Exam Vitals and nursing note  reviewed.  Constitutional:      General: He is not in acute distress.    Appearance: Normal appearance. He is not ill-appearing.  Cardiovascular:     Rate and Rhythm: Normal rate and regular rhythm.     Pulses: Normal pulses.     Heart sounds: Normal heart sounds.  Pulmonary:     Effort: Pulmonary effort is normal. No respiratory distress.     Breath sounds: Normal breath sounds.  Musculoskeletal:        General: Normal range of motion.  Skin:    General: Skin is warm and dry.     Findings: No rash.  Neurological:     General: No focal deficit present.     Mental Status: He is alert and oriented to person, place, and time.  Psychiatric:        Mood and Affect: Mood normal.        Behavior: Behavior normal.        Thought Content: Thought content normal.        Judgment: Judgment normal.     Assessment and Plan   1. Hyperlipidemia LDL goal <130 - CMP14+EGFR - Lipid panel  2. Generalized anxiety disorder - escitalopram (LEXAPRO) 10 MG tablet; TAKE 1 TABLET(10 MG) BY MOUTH DAILY  Dispense: 30 tablet; Refill: 5   Hld- slight increase, pt to cont with meds and dec cholesterol in diet.  Will rechekc labs.  GAD- stable. Doing well  with lexapro.  Will cont.  Return in about 6 months (around 11/15/2021) for f/u hld, anxiety.

## 2021-11-13 ENCOUNTER — Other Ambulatory Visit: Payer: Self-pay

## 2021-11-13 ENCOUNTER — Encounter: Payer: Self-pay | Admitting: Family Medicine

## 2021-11-13 ENCOUNTER — Ambulatory Visit: Payer: Managed Care, Other (non HMO) | Admitting: Family Medicine

## 2021-11-13 VITALS — BP 126/84 | HR 78 | Temp 97.3°F | Ht 70.0 in | Wt 294.0 lb

## 2021-11-13 DIAGNOSIS — Z130 Encounter for screening for diseases of the blood and blood-forming organs and certain disorders involving the immune mechanism: Secondary | ICD-10-CM

## 2021-11-13 DIAGNOSIS — F411 Generalized anxiety disorder: Secondary | ICD-10-CM | POA: Diagnosis not present

## 2021-11-13 DIAGNOSIS — E785 Hyperlipidemia, unspecified: Secondary | ICD-10-CM | POA: Diagnosis not present

## 2021-11-13 DIAGNOSIS — R7401 Elevation of levels of liver transaminase levels: Secondary | ICD-10-CM

## 2021-11-13 MED ORDER — ESCITALOPRAM OXALATE 10 MG PO TABS: ORAL_TABLET | ORAL | 3 refills | Status: DC

## 2021-11-13 MED ORDER — PRAVASTATIN SODIUM 40 MG PO TABS: 40.0000 mg | ORAL_TABLET | Freq: Every day | ORAL | 3 refills | Status: DC

## 2021-11-13 NOTE — Progress Notes (Signed)
Subjective:  Patient ID: Perry Huffman, male    DOB: 10-24-1975  Age: 46 y.o. MRN: [id number]  CC: Follow-up  HPI:  46 year old male with anxiety, hyperlipidemia presents for follow-up.  Patient reports that his anxiety is well controlled.  He states that he is doing well on Lexapro.  Needs refill.  GAD-7 score of 6.  Patient has hyperlipidemia.  Last LDL was 148.  This was in 2021.  He is currently on pravastatin.  Tolerating well.  Needs refill.  Unsure of control at this time.  Needs labs.  Patient Active Problem List   Diagnosis Date Noted   Morbid obesity ([place]) 11/13/2021   Kidney stones 03/13/2020   Generalized anxiety disorder 12/03/2013   Hyperlipidemia 01/10/2013    Social Hx   Social History   Socioeconomic History   Marital status: Married    Spouse name: Not on file   Number of children: 2   Years of education: Not on file   Highest education level: Not on file  Occupational History   Not on file  Tobacco Use   Smoking status: Never   Smokeless tobacco: Never  Substance and Sexual Activity   Alcohol use: Yes    Comment: occ.    Drug use: Never   Sexual activity: Not on file  Other Topics Concern   Not on file  Social History Narrative   Not on file   Social Determinants of Health   Financial Resource Strain: Not on file  Food Insecurity: Not on file  Transportation Needs: Not on file  Physical Activity: Not on file  Stress: Not on file  Social Connections: Not on file    Review of Systems  Constitutional: Negative.   Respiratory: Negative.    Cardiovascular: Negative.    Objective:  BP 126/84    Pulse 78    Temp (!) 97.3 F (36.3 C)    Ht 5' 10" (1.778 m)    Wt 294 lb (133.4 kg)    SpO2 97%    BMI 42.18 kg/m   BP/Weight 11/13/2021 05/15/2021 9/48/0165  Systolic BP 537 482 707  Diastolic BP 84 75 86  Wt. (Lbs) 294 286 285  BMI 42.18 41.04 40.89    Physical Exam Vitals and nursing note reviewed.  Constitutional:      General: He is not  in acute distress.    Appearance: Normal appearance. He is obese. He is not ill-appearing.  HENT:     Head: Normocephalic and atraumatic.  Eyes:     General:        Right eye: No discharge.        Left eye: No discharge.     Conjunctiva/sclera: Conjunctivae normal.  Cardiovascular:     Rate and Rhythm: Normal rate and regular rhythm.  Pulmonary:     Effort: Pulmonary effort is normal.     Breath sounds: Normal breath sounds. No wheezing, rhonchi or rales.  Neurological:     Mental Status: He is alert.  Psychiatric:        Mood and Affect: Mood normal.        Behavior: Behavior normal.    Lab Results  Component Value Date   WBC 7.8 06/13/2020   HGB 16.2 06/13/2020   HCT 47.4 06/13/2020   PLT 225 06/13/2020   GLUCOSE 91 06/13/2020   CHOL 214 (H) 06/13/2020   TRIG 122 06/13/2020   HDL 44 06/13/2020   LDLCALC 148 (H) 06/13/2020   ALT 45 (  H) 06/13/2020   AST 26 06/13/2020   NA 141 06/13/2020   K 4.4 06/13/2020   CL 102 06/13/2020   CREATININE 1.18 06/13/2020   BUN 15 06/13/2020   CO2 24 06/13/2020     Assessment & Plan:   Problem List Items Addressed This Visit       Other   Hyperlipidemia    Lipid panel today.  Continue pravastatin.  May need change in therapy or dose increase based on results.  Medication was refilled today.      Relevant Medications   pravastatin (PRAVACHOL) 40 MG tablet   Other Relevant Orders   Lipid panel   Generalized anxiety disorder - Primary    Stable.  Continue Lexapro.  Refilled today.      Relevant Medications   escitalopram (LEXAPRO) 10 MG tablet   Morbid obesity ([place])   Other Visit Diagnoses     Elevated ALT measurement       Relevant Orders   CMP14+EGFR   Screening for deficiency anemia       Relevant Orders   CBC       Meds ordered this encounter  Medications   escitalopram (LEXAPRO) 10 MG tablet    Sig: TAKE 1 TABLET(10 MG) BY MOUTH DAILY    Dispense:  90 tablet    Refill:  3   pravastatin (PRAVACHOL) 40  MG tablet    Sig: Take 1 tablet (40 mg total) by mouth daily.    Dispense:  90 tablet    Refill:  3    Follow-up:  Return in about 1 year (around 11/13/2022).  [place]

## 2021-11-13 NOTE — Patient Instructions (Signed)
Labs when you can.  I have refilled your medication.  Follow up annually.  Take care  Dr. Adriana Simas

## 2021-11-13 NOTE — Assessment & Plan Note (Signed)
Stable.  Continue Lexapro.  Refilled today. 

## 2021-11-13 NOTE — Assessment & Plan Note (Signed)
Lipid panel today.  Continue pravastatin.  May need change in therapy or dose increase based on results.  Medication was refilled today.

## 2021-12-06 LAB — CMP14+EGFR
ALT: 47 IU/L — ABNORMAL HIGH (ref 0–44)
AST: 31 IU/L (ref 0–40)
Albumin/Globulin Ratio: 1.8 (ref 1.2–2.2)
Albumin: 4.3 g/dL (ref 4.0–5.0)
Alkaline Phosphatase: 83 IU/L (ref 44–121)
BUN/Creatinine Ratio: 14 (ref 9–20)
BUN: 14 mg/dL (ref 6–24)
Bilirubin Total: 0.5 mg/dL (ref 0.0–1.2)
CO2: 23 mmol/L (ref 20–29)
Calcium: 9.1 mg/dL (ref 8.7–10.2)
Chloride: 105 mmol/L (ref 96–106)
Creatinine, Ser: 1.02 mg/dL (ref 0.76–1.27)
Globulin, Total: 2.4 g/dL (ref 1.5–4.5)
Glucose: 97 mg/dL (ref 70–99)
Potassium: 4.4 mmol/L (ref 3.5–5.2)
Sodium: 140 mmol/L (ref 134–144)
Total Protein: 6.7 g/dL (ref 6.0–8.5)
eGFR: 92 mL/min/{1.73_m2} (ref >59)

## 2021-12-06 LAB — CBC
Hematocrit: 46.7 % (ref 37.5–51.0)
Hemoglobin: 15.7 g/dL (ref 13.0–17.7)
MCH: 31.2 pg (ref 26.6–33.0)
MCHC: 33.6 g/dL (ref 31.5–35.7)
MCV: 93 fL (ref 79–97)
Platelets: 238 10*3/uL (ref 150–450)
RBC: 5.03 x10E6/uL (ref 4.14–5.80)
RDW: 12.7 % (ref 11.6–15.4)
WBC: 7 10*3/uL (ref 3.4–10.8)

## 2021-12-06 LAB — LIPID PANEL
Chol/HDL Ratio: 4.4 ratio (ref 0.0–5.0)
Cholesterol, Total: 183 mg/dL (ref 100–199)
HDL: 42 mg/dL (ref >39)
LDL Chol Calc (NIH): 115 mg/dL — ABNORMAL HIGH (ref 0–99)
Triglycerides: 145 mg/dL (ref 0–149)
VLDL Cholesterol Cal: 26 mg/dL (ref 5–40)

## 2021-12-08 ENCOUNTER — Other Ambulatory Visit: Payer: Self-pay | Admitting: *Deleted

## 2021-12-08 MED ORDER — PRAVASTATIN SODIUM 80 MG PO TABS: ORAL_TABLET | ORAL | 1 refills | Status: DC

## 2021-12-11 IMAGING — CT CT RENAL STONE PROTOCOL
2 of 5 series · 16 of 46 positions shown, 18 images · non-contrast
Comparison: None.

CLINICAL DATA: Left flank pain.

EXAM:
CT ABDOMEN AND PELVIS WITHOUT CONTRAST
TECHNIQUE: Multidetector CT imaging of the abdomen and pelvis was performed
following the standard protocol without IV contrast.

[Series 3: thins · axial · 0.84mm/px · z∈[+581,+1070]mm · 13 of 537 slices shown, 15 images]
[im 24/537  soft-tissue]
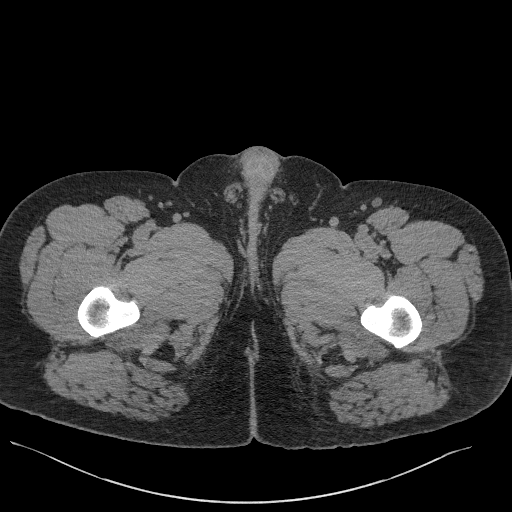
[im 24/537  bone]
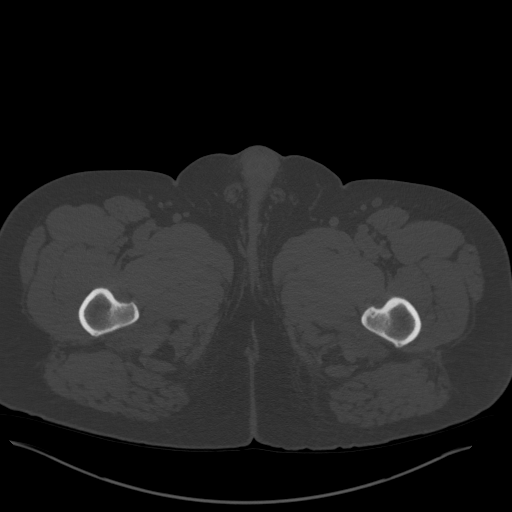
[im 70/537  soft-tissue]
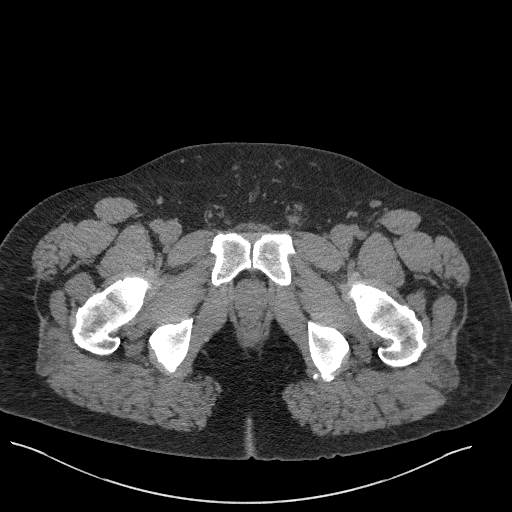
[im 117/537  soft-tissue]
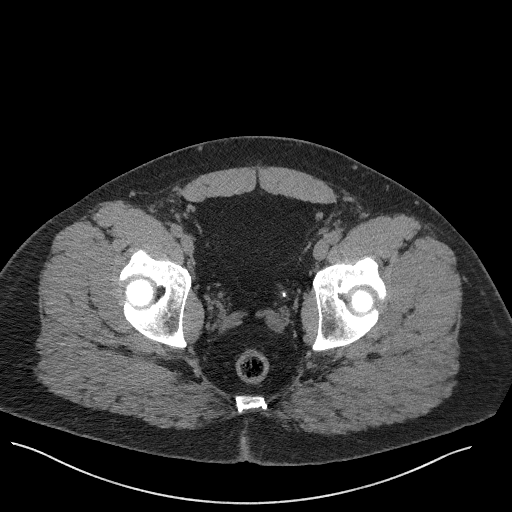
[im 140/537  soft-tissue]
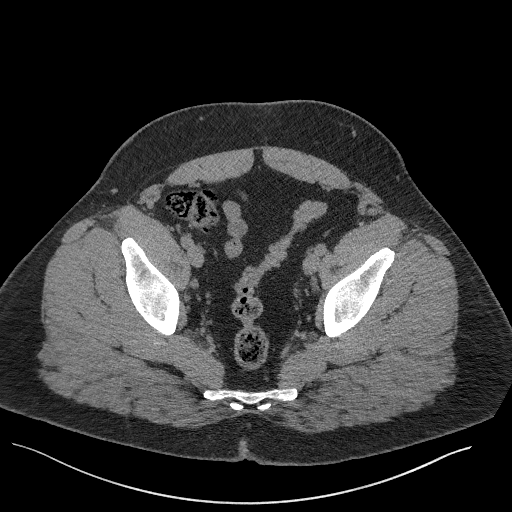
[im 187/537  soft-tissue]
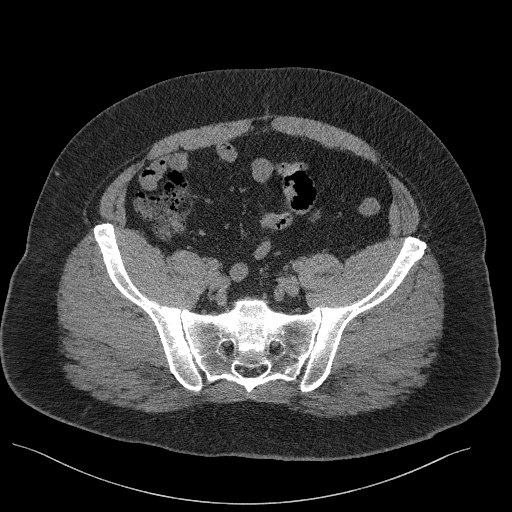
[im 234/537  soft-tissue]
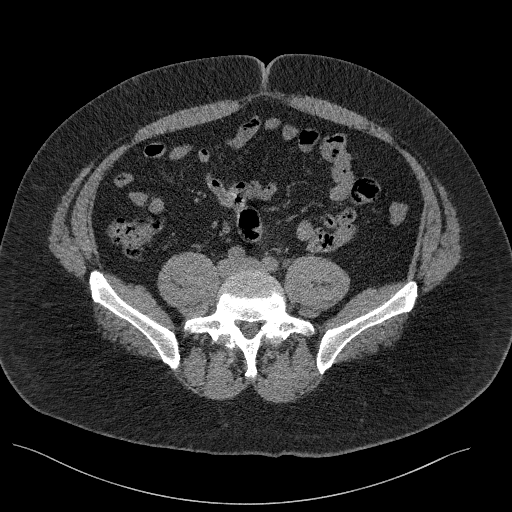
[im 280/537  soft-tissue]
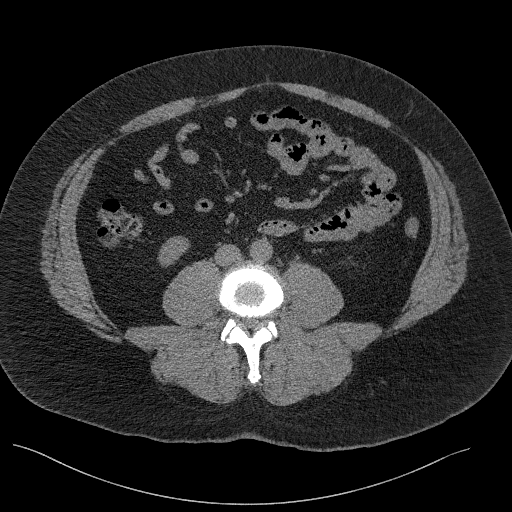
[im 303/537  soft-tissue]
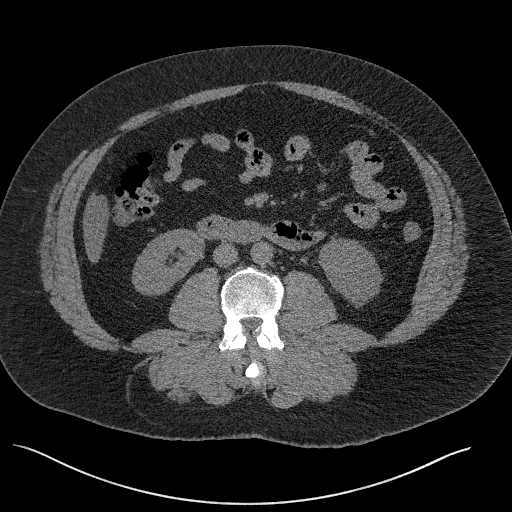
[im 350/537  soft-tissue]
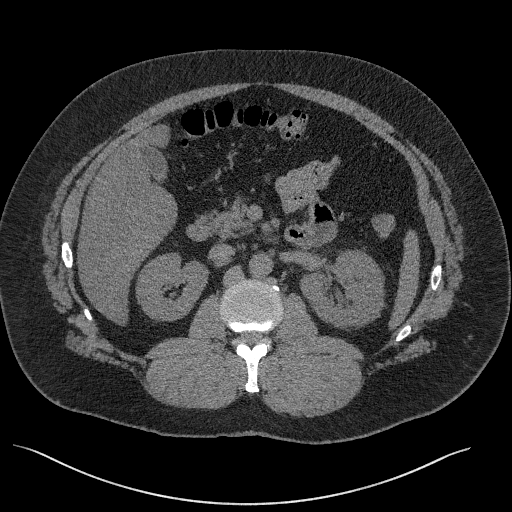
[im 350/537  bone]
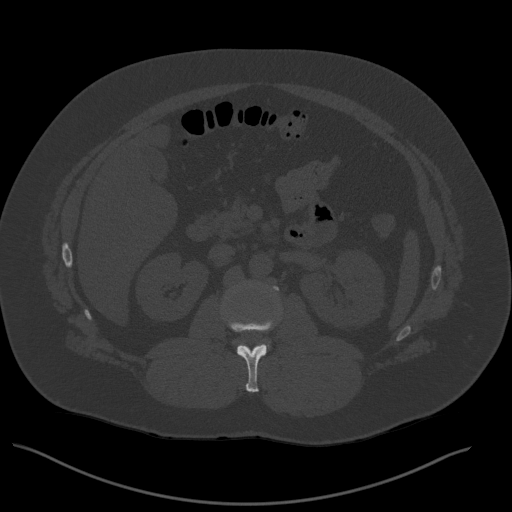
[im 397/537  soft-tissue]
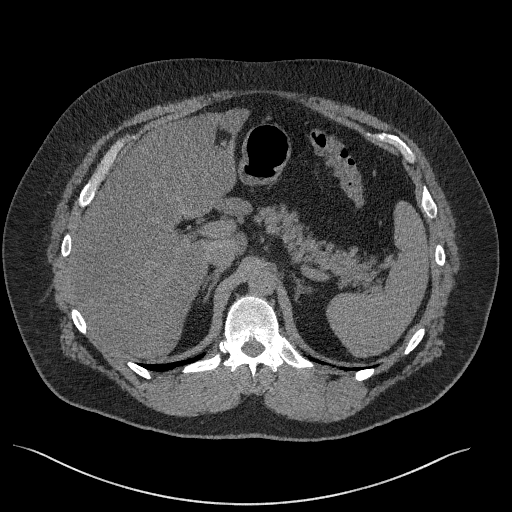
[im 420/537  soft-tissue]
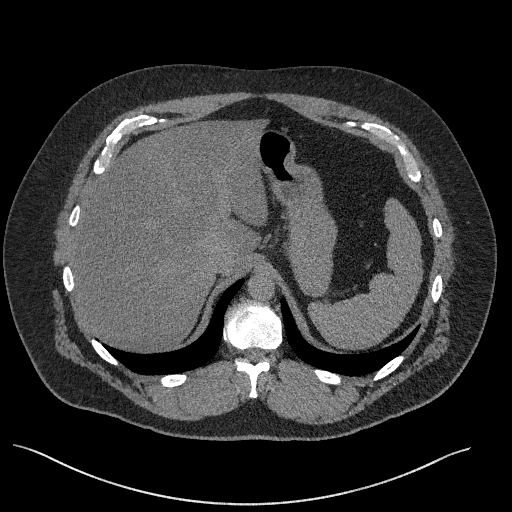
[im 467/537  soft-tissue]
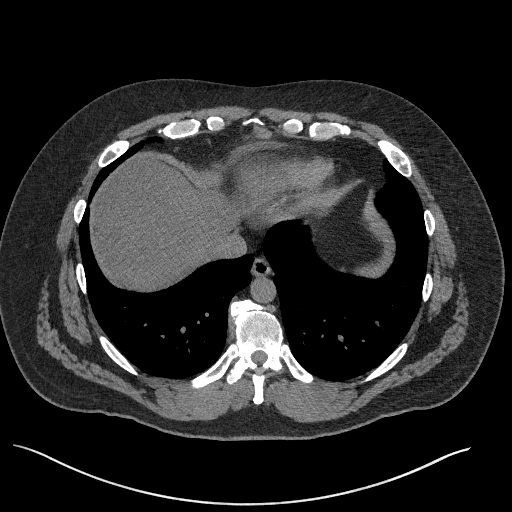
[im 513/537  soft-tissue]
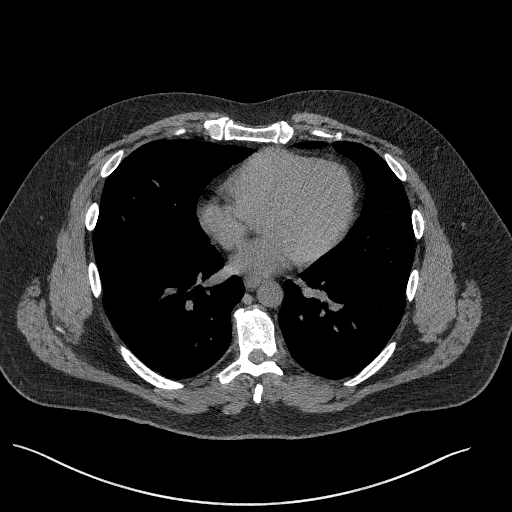

[Series 5: coronal st · coronal · 0.90mm/px · 3 of 111 slices shown]
[im 37/111  soft-tissue]
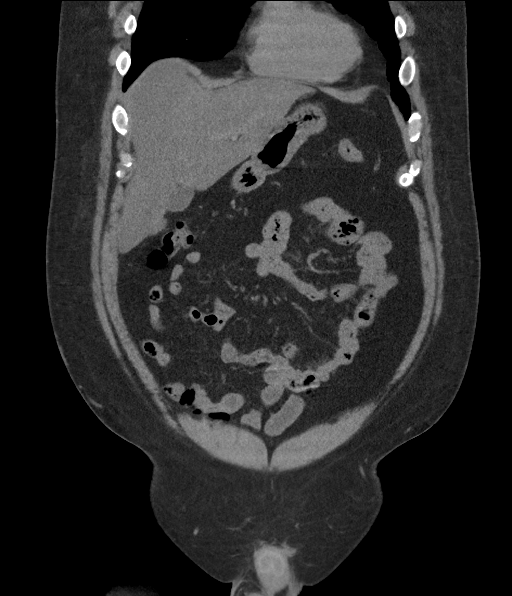
[im 49/111  soft-tissue]
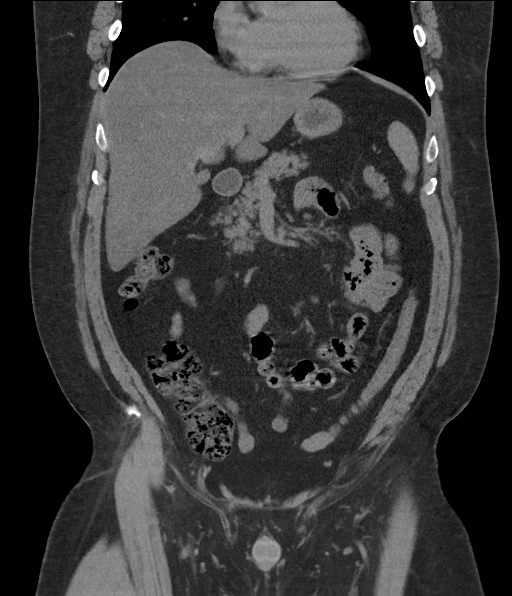
[im 62/111  soft-tissue]
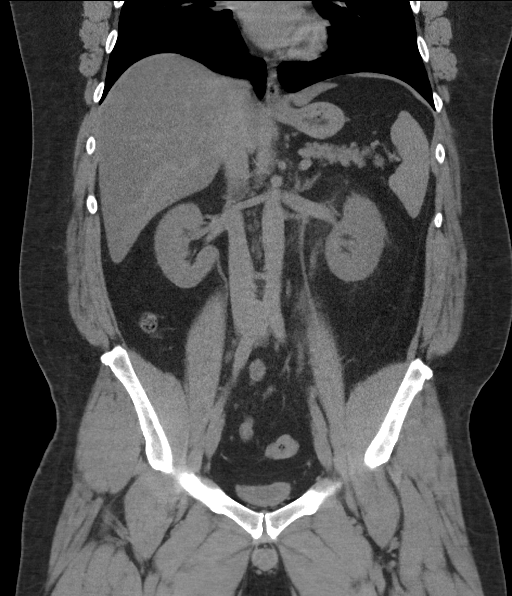

[16 of 46 positions shown; findings below may reference images not displayed]

FINDINGS: Lower chest: No acute abnormality.

Hepatobiliary: No focal liver abnormality is seen. Diffuse fatty
infiltration of the liver parenchyma is seen. No gallstones,
gallbladder wall thickening, or biliary dilatation.

Pancreas: Unremarkable. No pancreatic ductal dilatation or
surrounding inflammatory changes.

Spleen: Normal in size without focal abnormality.

Adrenals/Urinary Tract: Adrenal glands are unremarkable. Kidneys are
normal in size, without focal lesions. A 3 mm obstructing renal
stone is seen within the distal left ureter with mild left-sided
hydronephrosis, hydroureter and perinephric inflammatory fat
stranding. Bladder is unremarkable.

Stomach/Bowel: Stomach is within normal limits. Appendix appears
normal. No evidence of bowel wall thickening, distention, or
inflammatory changes.

Vascular/Lymphatic: No significant vascular findings are present. No
enlarged abdominal or pelvic lymph nodes.

Reproductive: Prostate is unremarkable.

Other: No abdominal wall hernia or abnormality. No abdominopelvic
ascites.

Musculoskeletal: No acute or significant osseous findings.
IMPRESSION: 1. 3 mm obstructing renal stone within the distal left ureter with
mild left-sided hydronephrosis, hydroureter and perinephric
inflammatory fat stranding.
2. Hepatic steatosis.

## 2021-12-14 ENCOUNTER — Ambulatory Visit (INDEPENDENT_AMBULATORY_CARE_PROVIDER_SITE_OTHER): Payer: Managed Care, Other (non HMO)

## 2021-12-14 ENCOUNTER — Ambulatory Visit
Admission: EM | Admit: 2021-12-14 | Discharge: 2021-12-14 | Disposition: A | Discharge status: Home / Self Care | Payer: Managed Care, Other (non HMO) | Attending: Urgent Care | Admitting: Urgent Care

## 2021-12-14 ENCOUNTER — Other Ambulatory Visit: Payer: Self-pay

## 2021-12-14 ENCOUNTER — Encounter: Payer: Self-pay | Admitting: Emergency Medicine

## 2021-12-14 DIAGNOSIS — R0989 Other specified symptoms and signs involving the circulatory and respiratory systems: Secondary | ICD-10-CM | POA: Diagnosis not present

## 2021-12-14 DIAGNOSIS — B349 Viral infection, unspecified: Secondary | ICD-10-CM

## 2021-12-14 DIAGNOSIS — R059 Cough, unspecified: Secondary | ICD-10-CM

## 2021-12-14 DIAGNOSIS — Z9189 Other specified personal risk factors, not elsewhere classified: Secondary | ICD-10-CM

## 2021-12-14 DIAGNOSIS — R051 Acute cough: Secondary | ICD-10-CM | POA: Diagnosis not present

## 2021-12-14 MED ORDER — BENZONATATE 100 MG PO CAPS: 100.0000 mg | ORAL_CAPSULE | Freq: Three times a day (TID) | ORAL | 0 refills | Status: DC | PRN

## 2021-12-14 MED ORDER — PROMETHAZINE-DM 6.25-15 MG/5ML PO SYRP: 5.0000 mL | ORAL_SOLUTION | Freq: Every evening | ORAL | 0 refills | Status: DC | PRN

## 2021-12-14 MED ORDER — PSEUDOEPHEDRINE HCL 60 MG PO TABS: 60.0000 mg | ORAL_TABLET | Freq: Three times a day (TID) | ORAL | 0 refills | Status: DC | PRN

## 2021-12-14 MED ORDER — LEVOCETIRIZINE DIHYDROCHLORIDE 5 MG PO TABS: 5.0000 mg | ORAL_TABLET | Freq: Every evening | ORAL | 0 refills | Status: DC

## 2021-12-14 NOTE — ED Provider Notes (Signed)
?[facility] ? ? ?MRN: [id number] DOB: 10-26-1975 ? ?Subjective:  ? ?Perry Huffman is a 46 y.o. male presenting for 5 day history of acute onset runny and stuffy nose, fever.  The symptoms improved except for the fever and now the congestion is in his chest with a persistent cough.  No chest pain, shortness of breath or wheezing.  He has a remote history of having asthma as a kid.  He is not a smoker.  Would like to be checked for COVID, flu.  Wants to make sure he does not have an infection in his lungs. ? ?No current facility-administered medications for this encounter. ? ?Current Outpatient Medications:  ?  escitalopram (LEXAPRO) 10 MG tablet, TAKE 1 TABLET(10 MG) BY MOUTH DAILY, Disp: 90 tablet, Rfl: 3 ?  pravastatin (PRAVACHOL) 80 MG tablet, Take 1 tablet by mouth Once a day., Disp: 90 tablet, Rfl: 1  ? ?No Known Allergies ? ?Past Medical History:  ?Diagnosis Date  ? Anxiety   ? Asthma   ? Heart burn   ? High cholesterol   ? Hypertension   ? Seizure (HCC)   ?  ? ?Past Surgical History:  ?Procedure Laterality Date  ? TYMPANOSTOMY TUBE PLACEMENT Bilateral 1980's  ? ? ?Family History  ?Family history unknown: Yes  ? ? ?Social History  ? ?Tobacco Use  ? Smoking status: Never  ? Smokeless tobacco: Never  ?Substance Use Topics  ? Alcohol use: Yes  ?  Comment: occ.   ? Drug use: Never  ? ? ?ROS ? ? ?Objective:  ? ?Vitals: ?BP (!) 142/93 (BP Location: Right Arm)   Pulse 92   Temp 98.4 ?F (36.9 ?C) (Oral)   Resp 18   SpO2 95%  ? ?Physical Exam ?Constitutional:   ?   General: He is not in acute distress. ?   Appearance: Normal appearance. He is well-developed. He is not ill-appearing, toxic-appearing or diaphoretic.  ?HENT:  ?   Head: Normocephalic and atraumatic.  ?   Right Ear: External ear normal.  ?   Left Ear: External ear normal.  ?   Nose: Congestion present. No rhinorrhea.  ?   Mouth/Throat:  ?   Mouth: Mucous membranes are moist.  ?   Pharynx: No oropharyngeal exudate or posterior  oropharyngeal erythema.  ?Eyes:  ?   General: No scleral icterus.    ?   Right eye: No discharge.     ?   Left eye: No discharge.  ?   Extraocular Movements: Extraocular movements intact.  ?Cardiovascular:  ?   Rate and Rhythm: Normal rate and regular rhythm.  ?   Heart sounds: Normal heart sounds. No murmur heard. ?  No friction rub. No gallop.  ?Pulmonary:  ?   Effort: Pulmonary effort is normal. No respiratory distress.  ?   Breath sounds: No stridor. No wheezing, rhonchi or rales.  ?   Comments: Slight decrease in lung sounds throughout which may be due to body habitus. ?Neurological:  ?   Mental Status: He is alert and oriented to person, place, and time.  ?Psychiatric:     ?   Mood and Affect: Mood normal.     ?   Behavior: Behavior normal.     ?   Thought Content: Thought content normal.  ? ? ?DG Chest 2 View ? ?Result Date: 12/14/2021 ?CLINICAL DATA:  46 year old male with history of cough and chest congestion. EXAM: CHEST - 2 VIEW COMPARISON:  Chest x-ray 12/03/2008.  FINDINGS: Lung volumes are normal. No consolidative airspace disease. No pleural effusions. No pneumothorax. No pulmonary nodule or mass noted. Pulmonary vasculature and the cardiomediastinal silhouette are within normal limits. IMPRESSION: No radiographic evidence of acute cardiopulmonary disease. Electronically Signed   By: Trudie Reed M.D.   On: 12/14/2021 11:21   ? ? ?Assessment and Plan :  ? ?PDMP not reviewed this encounter. ? ?1. Acute viral syndrome   ?2. At increased risk of exposure to COVID-19 virus   ?3. Acute cough   ?4. Chest congestion   ? ?Chest x-ray negative.  COVID flu swab pending.  Recommend supportive care for an acute viral syndrome. Counseled patient on potential for adverse effects with medications prescribed/recommended today, ER and return-to-clinic precautions discussed, patient verbalized understanding. ? ?  ?Wallis Bamberg, PA-C ?12/14/21 1129 ? ?

## 2021-12-14 NOTE — ED Triage Notes (Signed)
Fever, nasal congestion and chest congestion since Wednesday.  Has been taking over the counter products without relief.   ?

## 2021-12-15 LAB — COVID-19, FLU A+B NAA
Influenza A, NAA: NOT DETECTED
Influenza B, NAA: NOT DETECTED
SARS-CoV-2, NAA: NOT DETECTED

## 2022-04-29 ENCOUNTER — Ambulatory Visit: Payer: Managed Care, Other (non HMO) | Admitting: Family Medicine

## 2022-04-29 DIAGNOSIS — F411 Generalized anxiety disorder: Secondary | ICD-10-CM | POA: Diagnosis not present

## 2022-04-29 DIAGNOSIS — E785 Hyperlipidemia, unspecified: Secondary | ICD-10-CM

## 2022-04-29 MED ORDER — PRAVASTATIN SODIUM 80 MG PO TABS: ORAL_TABLET | ORAL | 3 refills | Status: DC

## 2022-04-29 MED ORDER — ESCITALOPRAM OXALATE 10 MG PO TABS: ORAL_TABLET | ORAL | 3 refills | Status: DC

## 2022-04-29 NOTE — Assessment & Plan Note (Signed)
Continue pravastatin.  Labs at next visit.

## 2022-04-29 NOTE — Assessment & Plan Note (Signed)
Stable.  Continue Lexapro. 

## 2022-04-29 NOTE — Progress Notes (Signed)
Subjective:  Patient ID: Perry Huffman, male    DOB: 1975/11/06  Age: 46 y.o. MRN: [id number]  CC: Chief Complaint  Patient presents with   Hyperlipidemia    Pt here to refill Pravastatin. No issues at this time    HPI:  46 year old male with history of kidney stones, morbid obesity, hyperlipidemia, anxiety presents for follow-up.  Patient states that he is doing well.  Anxiety stable on Lexapro.  Patient's pravastatin was previously increased to 80 mg daily.  He has been tolerating without difficulty.  Will need lipid panel in the near future.  Needs refill today.  Patient is due for colonoscopy.  Advised to consider.  Patient Active Problem List   Diagnosis Date Noted   Morbid obesity (HCC) 11/13/2021   Kidney stones 03/13/2020   Generalized anxiety disorder 12/03/2013   Hyperlipidemia 01/10/2013    Social Hx   Social History   Socioeconomic History   Marital status: Married    Spouse name: Not on file   Number of children: 2   Years of education: Not on file   Highest education level: Not on file  Occupational History   Not on file  Tobacco Use   Smoking status: Never   Smokeless tobacco: Never  Substance and Sexual Activity   Alcohol use: Yes    Comment: occ.    Drug use: Never   Sexual activity: Not on file  Other Topics Concern   Not on file  Social History Narrative   Not on file   Social Determinants of Health   Financial Resource Strain: Not on file  Food Insecurity: Not on file  Transportation Needs: Not on file  Physical Activity: Not on file  Stress: Not on file  Social Connections: Not on file    Review of Systems  Constitutional: Negative.   Respiratory: Negative.    Cardiovascular: Negative.    Objective:  BP 123/82   Pulse 77   Temp 98.2 F (36.8 C)   Wt 283 lb 6.4 oz (128.5 kg)   SpO2 100%   BMI 40.66 kg/m      04/29/2022    8:32 AM 12/14/2021   10:48 AM 11/13/2021    8:37 AM  BP/Weight  Systolic BP 123 142 126   Diastolic BP 82 93 84  Wt. (Lbs) 283.4  294  BMI 40.66 kg/m2  42.18 kg/m2    Physical Exam Vitals and nursing note reviewed.  Constitutional:      General: He is not in acute distress.    Appearance: Normal appearance.  HENT:     Head: Normocephalic and atraumatic.  Eyes:     General:        Right eye: No discharge.        Left eye: No discharge.     Conjunctiva/sclera: Conjunctivae normal.  Cardiovascular:     Rate and Rhythm: Normal rate and regular rhythm.  Pulmonary:     Effort: Pulmonary effort is normal.     Breath sounds: Normal breath sounds. No wheezing, rhonchi or rales.  Neurological:     Mental Status: He is alert.  Psychiatric:        Mood and Affect: Mood normal.        Behavior: Behavior normal.     Lab Results  Component Value Date   WBC 7.0 12/05/2021   HGB 15.7 12/05/2021   HCT 46.7 12/05/2021   PLT 238 12/05/2021   GLUCOSE 97 12/05/2021   CHOL 183 12/05/2021  TRIG 145 12/05/2021   HDL 42 12/05/2021   LDLCALC 115 (H) 12/05/2021   ALT 47 (H) 12/05/2021   AST 31 12/05/2021   NA 140 12/05/2021   K 4.4 12/05/2021   CL 105 12/05/2021   CREATININE 1.02 12/05/2021   BUN 14 12/05/2021   CO2 23 12/05/2021     Assessment & Plan:   Problem List Items Addressed This Visit       Other   Hyperlipidemia    Continue pravastatin.  Labs at next visit.      Relevant Medications   pravastatin (PRAVACHOL) 80 MG tablet   Generalized anxiety disorder    Stable.  Continue Lexapro.      Relevant Medications   escitalopram (LEXAPRO) 10 MG tablet    Meds ordered this encounter  Medications   escitalopram (LEXAPRO) 10 MG tablet    Sig: TAKE 1 TABLET(10 MG) BY MOUTH DAILY    Dispense:  90 tablet    Refill:  3   pravastatin (PRAVACHOL) 80 MG tablet    Sig: Take 1 tablet by mouth Once a day.    Dispense:  90 tablet    Refill:  3    Follow-up:  Return in about 1 year (around 04/30/2023).  Everlene Other DO [facility]

## 2022-04-29 NOTE — Patient Instructions (Signed)
Consider colonoscopy.  1 year of medication sent to the pharmacy.  Follow up annually.  Take care  Dr. Adriana Simas

## 2023-05-17 ENCOUNTER — Ambulatory Visit: Payer: Managed Care, Other (non HMO) | Admitting: Family Medicine

## 2023-05-17 VITALS — BP 152/94 | HR 94 | Temp 98.1°F | Ht 70.0 in | Wt 287.6 lb

## 2023-05-17 DIAGNOSIS — E785 Hyperlipidemia, unspecified: Secondary | ICD-10-CM | POA: Diagnosis not present

## 2023-05-17 DIAGNOSIS — Z130 Encounter for screening for diseases of the blood and blood-forming organs and certain disorders involving the immune mechanism: Secondary | ICD-10-CM | POA: Diagnosis not present

## 2023-05-17 DIAGNOSIS — F411 Generalized anxiety disorder: Secondary | ICD-10-CM

## 2023-05-17 DIAGNOSIS — G4733 Obstructive sleep apnea (adult) (pediatric): Secondary | ICD-10-CM

## 2023-05-17 MED ORDER — PRAVASTATIN SODIUM 80 MG PO TABS: ORAL_TABLET | ORAL | 3 refills | Status: DC

## 2023-05-17 MED ORDER — ESCITALOPRAM OXALATE 10 MG PO TABS: ORAL_TABLET | ORAL | 3 refills | Status: DC

## 2023-05-17 NOTE — Patient Instructions (Addendum)
Medications refilled.  Labs ordered.  Let me know about colonoscopy.  Referral placed for evaluation of sleep apnea.

## 2023-05-17 NOTE — Progress Notes (Unsigned)
Subjective:  Patient ID: Perry Huffman, male    DOB: 1976-08-03  Age: 47 y.o. MRN: [id number]  CC:  Follow up   HPI:  47 year old male with obesity, GAD, HLD presents for follow up.  Patient requesting colonoscopy.  He states that he is unsure of the provider he wants to use.  He will reach back out to me and give me the information so that referral can be placed.  Patient reports ongoing fatigue.  Feels very tired.  Daytime somnolence.  Patient states that he snores heavily.  Unsure of apnea as his wife does not sleep in the same room as him.  BP elevated here today.  This is not normal for him.  Patient requesting refills on Lexapro and pravastatin.  Update labs to assess lipid control.  Anxiety stable on Lexapro.  Patient Active Problem List   Diagnosis Date Noted   OSA (obstructive sleep apnea) 05/18/2023   Morbid obesity (HCC) 11/13/2021   Kidney stones 03/13/2020   Generalized anxiety disorder 12/03/2013   Hyperlipidemia 01/10/2013    Social Hx   Social History   Socioeconomic History   Marital status: Married    Spouse name: Not on file   Number of children: 2   Years of education: Not on file   Highest education level: 12th grade  Occupational History   Not on file  Tobacco Use   Smoking status: Never   Smokeless tobacco: Never  Substance and Sexual Activity   Alcohol use: Yes    Comment: occ.    Drug use: Never   Sexual activity: Not on file  Other Topics Concern   Not on file  Social History Narrative   Not on file   Social Determinants of Health   Financial Resource Strain: Low Risk  (05/16/2023)   Overall Financial Resource Strain (CARDIA)    Difficulty of Paying Living Expenses: Not very hard  Food Insecurity: No Food Insecurity (05/16/2023)   Hunger Vital Sign    Worried About Running Out of Food in the Last Year: Never true    Ran Out of Food in the Last Year: Never true  Transportation Needs: No Transportation Needs (05/16/2023)   PRAPARE -  Administrator, Civil Service (Medical): No    Lack of Transportation (Non-Medical): No  Physical Activity: Unknown (05/16/2023)   Exercise Vital Sign    Days of Exercise per Week: 0 days    Minutes of Exercise per Session: Not on file  Stress: No Stress Concern Present (05/16/2023)   [employer] of Occupational Health - Occupational Stress Questionnaire    Feeling of Stress : Only a little  Social Connections: Socially Integrated (05/16/2023)   Social Connection and Isolation Panel [NHANES]    Frequency of Communication with Friends and Family: More than three times a week    Frequency of Social Gatherings with Friends and Family: More than three times a week    Attends Religious Services: 1 to 4 times per year    Active Member of [employer] or Organizations: Yes    Attends Engineer, structural: More than 4 times per year    Marital Status: Married    Review of Systems Per HPI  Objective:  BP (!) 152/94   Pulse 94   Temp 98.1 F (36.7 C)   Ht 5\' 10"  (1.778 m)   Wt 287 lb 9.6 oz (130.5 kg)   SpO2 96%   BMI 41.27 kg/m  05/17/2023    1:44 PM 04/29/2022    8:32 AM 12/14/2021   10:48 AM  BP/Weight  Systolic BP 152 123 142  Diastolic BP 94 82 93  Wt. (Lbs) 287.6 283.4   BMI 41.27 kg/m2 40.66 kg/m2     Physical Exam Vitals and nursing note reviewed.  Constitutional:      General: He is not in acute distress.    Appearance: Normal appearance. He is obese.  HENT:     Head: Normocephalic and atraumatic.  Cardiovascular:     Rate and Rhythm: Normal rate and regular rhythm.  Pulmonary:     Effort: Pulmonary effort is normal.     Breath sounds: Normal breath sounds. No wheezing, rhonchi or rales.  Neurological:     Mental Status: He is alert.  Psychiatric:        Mood and Affect: Mood normal.        Behavior: Behavior normal.     Lab Results  Component Value Date   WBC 7.0 12/05/2021   HGB 15.7 12/05/2021   HCT 46.7 12/05/2021   PLT 238  12/05/2021   GLUCOSE 97 12/05/2021   CHOL 183 12/05/2021   TRIG 145 12/05/2021   HDL 42 12/05/2021   LDLCALC 115 (H) 12/05/2021   ALT 47 (H) 12/05/2021   AST 31 12/05/2021   NA 140 12/05/2021   K 4.4 12/05/2021   CL 105 12/05/2021   CREATININE 1.02 12/05/2021   BUN 14 12/05/2021   CO2 23 12/05/2021     Assessment & Plan:   Problem List Items Addressed This Visit       Respiratory   OSA (obstructive sleep apnea) - Primary    Given morbid obesity, fatigue and daytime somnolence, and snoring there is a high probability that he has obstructive sleep apnea.  Placing referral to pulmonology.      Relevant Orders   Ambulatory referral to Pulmonology     Other   Morbid obesity (HCC)   Relevant Orders   CMP14+EGFR   Hemoglobin A1c   Generalized anxiety disorder    Stable.  Continue Lexapro.  Refilled today.      Relevant Medications   escitalopram (LEXAPRO) 10 MG tablet   Hyperlipidemia    Lipid panel to reassess.  Pravastatin refilled.      Relevant Medications   pravastatin (PRAVACHOL) 80 MG tablet   Other Relevant Orders   Lipid panel   Other Visit Diagnoses     Screening for deficiency anemia       Relevant Orders   CBC       Meds ordered this encounter  Medications   escitalopram (LEXAPRO) 10 MG tablet    Sig: TAKE 1 TABLET(10 MG) BY MOUTH DAILY    Dispense:  90 tablet    Refill:  3   pravastatin (PRAVACHOL) 80 MG tablet    Sig: Take 1 tablet by mouth Once a day.    Dispense:  90 tablet    Refill:  3    Morgon Pamer DO [facility]

## 2023-05-18 DIAGNOSIS — G4733 Obstructive sleep apnea (adult) (pediatric): Secondary | ICD-10-CM | POA: Insufficient documentation

## 2023-05-18 DIAGNOSIS — G4730 Sleep apnea, unspecified: Secondary | ICD-10-CM | POA: Insufficient documentation

## 2023-05-18 DIAGNOSIS — R0683 Snoring: Secondary | ICD-10-CM | POA: Insufficient documentation

## 2023-05-18 HISTORY — DX: Sleep apnea, unspecified: G47.30

## 2023-05-18 NOTE — Assessment & Plan Note (Signed)
Stable.  Continue Lexapro.  Refilled today. 

## 2023-05-18 NOTE — Assessment & Plan Note (Signed)
Given morbid obesity, fatigue and daytime somnolence, and snoring there is a high probability that he has obstructive sleep apnea.  Placing referral to pulmonology.

## 2023-05-18 NOTE — Assessment & Plan Note (Signed)
Lipid panel to reassess.  Pravastatin refilled.

## 2023-05-19 ENCOUNTER — Telehealth: Payer: Self-pay

## 2023-05-19 DIAGNOSIS — Z1211 Encounter for screening for malignant neoplasm of colon: Secondary | ICD-10-CM

## 2023-05-19 NOTE — Telephone Encounter (Signed)
Perry Sams, DO     Please place referral for colonoscopy.

## 2023-05-19 NOTE — Telephone Encounter (Signed)
Referral ordered in EPIC. 

## 2023-05-19 NOTE — Telephone Encounter (Signed)
Pt was calling Dr Adriana Simas back for referral name for colonoscopy [facility] [address] and Dr Rozanna Box [phone]

## 2023-05-25 ENCOUNTER — Encounter: Payer: Self-pay | Admitting: *Deleted

## 2023-05-27 ENCOUNTER — Other Ambulatory Visit: Payer: Self-pay | Admitting: Family Medicine

## 2023-05-27 MED ORDER — ROSUVASTATIN CALCIUM 10 MG PO TABS: 10.0000 mg | ORAL_TABLET | Freq: Every day | ORAL | 3 refills | Status: DC

## 2023-06-21 ENCOUNTER — Ambulatory Visit (INDEPENDENT_AMBULATORY_CARE_PROVIDER_SITE_OTHER): Payer: Managed Care, Other (non HMO) | Admitting: Primary Care

## 2023-06-21 ENCOUNTER — Encounter: Payer: Self-pay | Admitting: Primary Care

## 2023-06-21 VITALS — BP 118/70 | HR 56 | Ht 69.0 in | Wt 283.8 lb

## 2023-06-21 DIAGNOSIS — R0683 Snoring: Secondary | ICD-10-CM | POA: Diagnosis not present

## 2023-06-21 NOTE — Assessment & Plan Note (Signed)
-   Patient has symptoms of loud snoring, non-restorative sleep and daytime sleepiness. Epworth 19. BMI 41. At risk for sleep apnea due to obesity. Concern patient could had OSA, needs home sleep study to evaluate. Reviewed risk of untreated sleep apnea including cardiac arrhythmias, stroke, pulmonary hypertension and diabetes.  We also discussed treatment options including weight loss, oral appliance, CPAP therapy or referral to ENT for possible surgical options.  Encourage-position weight loss.  Advised against driving if experiencing excessive daytime sleepiness fatigue.  Follow-up 1 to 2 weeks after sleep study review results and treatment options if needed.

## 2023-06-21 NOTE — Progress Notes (Signed)
@Patient  ID: Perry Huffman, male    DOB: [birth year], 47 y.o.   MRN: [id number]  Chief Complaint  Patient presents with   Consult    Sleep Consult    Referring provider: Tommie Sams, DO  HPI: 47 year old male. Never smoked. PMH significant for anxiety disorder, hyperlipidemia, obesity.   06/21/2023 Patient presents today for sleep consult.  Patient reports nonrestorative.  He feels sleepy after waking up in the morning despite getting 6 or 7 hours of sleep. He is tired throughout the day. He has been told by his wife that he snores loudly.  He has woken himself up gasping or choking at night.  Typical bedtime is between 10 and 11 PM. He wakes up a couple of times a night. He starts his day at 5/5:30am. He is actively working on weight loss. Reports better sleep when he weighed less. No previous sleep studies. He does not operate heavy machinery. No concern for narcolepsy, cataplexy or sleep walking. Epworth 19/24.   Sleep questionnaire Symptoms-   loud snoring, waking up gasping/choking, restless sleep and daytime sleepiness Prior sleep study-  Bedtime- 10-11pm Time to fall asleep- 10-30 mins Nocturnal awakenings- several  Out of bed/start of day- 5-5:30am  Weight changes- none Do you operate heavy machinery- no Do you currently wear CPAP- no Do you current wear oxygen- no Epworth- 19   No Known Allergies  Immunization History  Administered Date(s) Administered   Influenza,inj,Quad PF,6+ Mos 07/16/2015, 06/14/2018   Influenza-Unspecified 10/25/2013, 09/21/2014, 07/31/2016, 08/21/2017   Moderna Sars-Covid-2 Vaccination 04/19/2020, 06/07/2020   Tdap 11/05/2020    Past Medical History:  Diagnosis Date   Anxiety    Asthma    Heart burn    High cholesterol    Hypertension    Seizure (HCC)     Tobacco History: Social History   Tobacco Use  Smoking Status Never  Smokeless Tobacco Never   Counseling given: Not Answered   Outpatient Medications Prior to Visit   Medication Sig Dispense Refill   escitalopram (LEXAPRO) 10 MG tablet TAKE 1 TABLET(10 MG) BY MOUTH DAILY 90 tablet 3   rosuvastatin (CRESTOR) 10 MG tablet Take 1 tablet (10 mg total) by mouth daily. 90 tablet 3   No facility-administered medications prior to visit.      Review of Systems  Review of Systems  Constitutional:  Positive for fatigue.  HENT: Negative.  Negative for trouble swallowing.        Dry mouth  Respiratory: Negative.  Negative for cough and wheezing.        SOB with exercise  Cardiovascular: Negative.   Musculoskeletal:        Joint pain due to cholesterol medication   Skin: Negative.  Negative for rash.  Psychiatric/Behavioral:  Negative for sleep disturbance.      Physical Exam  BP 118/70 (BP Location: Right Arm, Cuff Size: Large)   Pulse (!) 56   Ht 5\' 9"  (1.753 m)   Wt 283 lb 12.8 oz (128.7 kg)   SpO2 99%   BMI 41.91 kg/m  Physical Exam Constitutional:      Appearance: Normal appearance.  HENT:     Head: Normocephalic and atraumatic.     Mouth/Throat:     Mouth: Mucous membranes are moist.     Pharynx: Oropharynx is clear.  Cardiovascular:     Rate and Rhythm: Normal rate and regular rhythm.  Pulmonary:     Effort: Pulmonary effort is normal.  Breath sounds: Normal breath sounds.  Musculoskeletal:        General: Normal range of motion.  Skin:    General: Skin is warm and dry.  Neurological:     General: No focal deficit present.     Mental Status: He is alert and oriented to person, place, and time. Mental status is at baseline.  Psychiatric:        Mood and Affect: Mood normal.        Behavior: Behavior normal.        Thought Content: Thought content normal.        Judgment: Judgment normal.      Lab Results:  CBC    Component Value Date/Time   WBC 7.0 05/20/2023 0919   WBC 12.2 (H) 02/29/2020 1659   RBC 5.01 05/20/2023 0919   RBC 5.27 02/29/2020 1659   HGB 15.6 05/20/2023 0919   HCT 46.0 05/20/2023 0919   PLT 216  05/20/2023 0919   MCV 92 05/20/2023 0919   MCH 31.1 05/20/2023 0919   MCH 30.7 02/29/2020 1659   MCHC 33.9 05/20/2023 0919   MCHC 33.0 02/29/2020 1659   RDW 13.0 05/20/2023 0919   LYMPHSABS 0.8 02/29/2020 1659   MONOABS 0.6 02/29/2020 1659   EOSABS 0.0 02/29/2020 1659   BASOSABS 0.1 02/29/2020 1659    BMET    Component Value Date/Time   NA 140 05/20/2023 0919   K 4.3 05/20/2023 0919   CL 105 05/20/2023 0919   CO2 21 05/20/2023 0919   GLUCOSE 96 05/20/2023 0919   GLUCOSE 122 (H) 02/29/2020 1659   BUN 15 05/20/2023 0919   CREATININE 0.98 05/20/2023 0919   CREATININE 0.97 12/06/2014 0735   CALCIUM 8.9 05/20/2023 0919   GFRNONAA 75 06/13/2020 0846   GFRAA 87 06/13/2020 0846    BNP No results found for: "BNP"  ProBNP No results found for: "PROBNP"  Imaging: No results found.   Assessment & Plan:   Loud snoring - Patient has symptoms of loud snoring, non-restorative sleep and daytime sleepiness. Epworth 19. BMI 41. At risk for sleep apnea due to obesity. Concern patient could had OSA, needs home sleep study to evaluate. Reviewed risk of untreated sleep apnea including cardiac arrhythmias, stroke, pulmonary hypertension and diabetes.  We also discussed treatment options including weight loss, oral appliance, CPAP therapy or referral to ENT for possible surgical options.  Encourage-position weight loss.  Advised against driving if experiencing excessive daytime sleepiness fatigue.  Follow-up 1 to 2 weeks after sleep study review results and treatment options if needed.   Glenford Bayley, NP 06/21/2023

## 2023-06-21 NOTE — Patient Instructions (Addendum)
Sleep apnea is defined as period of 10 seconds or longer when you stop breathing at night. This can happen multiple times a night. Dx sleep apnea is when this occurs more than 5 times an hour.    Mild OSA 5-15 apneic events an hour Moderate OSA 15-30 apneic events an hour Severe OSA > 30 apneic events an hour   Untreated sleep apnea puts you at higher risk for cardiac arrhythmias, pulmonary HTN, stroke and diabetes  Treatment options include weight loss, side sleeping position, oral appliance, CPAP therapy or referral to ENT for possible surgical options    Recommendations: Focus on side sleeping position or elevate head with wedge pillow 30 degrees Work on weight loss efforts if able  Do not drive if experiencing excessive daytime sleepiness of fatigue    Orders: Home sleep study re: loud snoring (ordered)    Follow-up: Please call to schedule follow-up 1-2 weeks after completing home sleep study to review results and treatment if needed (can be virtual)   Sleep Apnea  Sleep apnea is a condition that affects your breathing while you are sleeping. Your tongue or soft tissue in your throat may block the flow of air while you sleep. You may have shallow breathing or stop breathing for short periods of time. People with sleep apnea may snore loudly. There are three kinds of sleep apnea: Obstructive sleep apnea. This kind is caused by a blocked or collapsed airway. This is the most common. Central sleep apnea. This kind happens when the part of the brain that controls breathing does not send the correct signals to the muscles that control breathing. Mixed sleep apnea. This is a combination of obstructive and central sleep apnea. What are the causes? The most common cause of sleep apnea is a collapsed or blocked airway. What increases the risk? Being very overweight. Having family members with sleep apnea. Having a tongue or tonsils that are larger than normal. Having a small airway  or jaw problems. Being older. What are the signs or symptoms? Loud snoring. Restless sleep. Trouble staying asleep. Being sleepy or tired during the day. Waking up gasping or choking. Having a headache in the morning. Mood swings. Having a hard time remembering things and concentrating. How is this diagnosed? A medical history. A physical exam. A sleep study. This is also called a polysomnography test. This test is done at a sleep lab or in your home while you are sleeping. How is this treated? Treatment may include: Sleeping on your side. Losing weight if you're overweight. Wearing an oral appliance. This is a mouthpiece that moves your lower jaw forward. Using a positive airway pressure (PAP) device to keep your airways open while you sleep, such as: A continuous positive airway pressure (CPAP) device. This device gives forced air through a mask when you breathe out. This keeps your airways open. A bilevel positive airway pressure (BIPAP) device. This device gives forced air through a mask when you breathe in and when you breathe out to keep your airways open. Having surgery if other treatments do not work. If your sleep apnea is not treated, you may be at risk for: Heart failure. Heart attack. Stroke. Type 2 diabetes or a problem with your blood sugar called insulin resistance. Follow these instructions at home: Medicines Take your medicines only as told by your health care provider. Avoid alcohol, medicines to help you relax, and certain pain medicines. These may make sleep apnea worse. General instructions Do not smoke, vape, or  use products with nicotine or tobacco in them. If you need help quitting, talk with your provider. If you were given a PAP device to open your airway while you sleep, use it as told by your provider. If you're having surgery, make sure to tell your provider you have sleep apnea. You may need to bring your PAP device with you. Contact a health care  provider if: The PAP device that you were given to use during sleep bothers you or does not seem to be working. You do not feel better or you feel worse. Get help right away if: You have trouble breathing. You have chest pain. You have trouble talking. One side of your body feels weak. A part of your face is hanging down. These symptoms may be an emergency. Call 911 right away. Do not wait to see if the symptoms will go away. Do not drive yourself to the hospital. This information is not intended to replace advice given to you by your health care provider. Make sure you discuss any questions you have with your health care provider. Document Revised: 12/03/2022 Document Reviewed: 12/03/2022 Elsevier Patient Education  2024 [employer].

## 2023-06-22 NOTE — Progress Notes (Signed)
Reviewed and agree with assessment/plan.   Coralyn Helling, MD [facility]monary/Critical Care 06/22/2023, 5:51 PM Pager:  [phone]

## 2023-07-06 LAB — HM COLONOSCOPY

## 2023-07-19 ENCOUNTER — Ambulatory Visit (INDEPENDENT_AMBULATORY_CARE_PROVIDER_SITE_OTHER): Payer: Managed Care, Other (non HMO) | Admitting: Primary Care

## 2023-07-19 DIAGNOSIS — G4733 Obstructive sleep apnea (adult) (pediatric): Secondary | ICD-10-CM | POA: Diagnosis not present

## 2023-07-19 DIAGNOSIS — R0683 Snoring: Secondary | ICD-10-CM

## 2023-07-20 NOTE — Progress Notes (Signed)
Please let patient know home sleep study showed SEVERE obstructive sleep apnea, he had an average 40 apneas per hour. Please set up virtual visit to discuss.  OK to double book only on the day that I am not already, please wright in apt note approved by me. Friday afternoons should be open now as well for virtual visits

## 2023-07-30 ENCOUNTER — Telehealth: Payer: Managed Care, Other (non HMO) | Admitting: Primary Care

## 2023-07-30 ENCOUNTER — Encounter: Payer: Self-pay | Admitting: Primary Care

## 2023-07-30 ENCOUNTER — Encounter: Payer: Self-pay | Admitting: *Deleted

## 2023-07-30 DIAGNOSIS — G4733 Obstructive sleep apnea (adult) (pediatric): Secondary | ICD-10-CM | POA: Diagnosis not present

## 2023-07-30 NOTE — Patient Instructions (Signed)
Sleep study showed severe obstructive sleep apnea, you had on average 40 apneic events per hour  Recommendations Aim to wear CPAP nightly 4 to 6 hours or longer Work on weight loss efforts as able Focus on side sleeping position Do not drive if experiencing excessive daytime sleepiness fatigue  Orders Auto CPAP 5 to 20 cm H2O  Follow-up 8 weeks with Waynetta Sandy NP (virtual visit okay on Friday afternoon)   CPAP and BIPAP Information CPAP and BIPAP are methods that use air pressure to keep your airways open and to help you breathe well. CPAP and BIPAP use different amounts of pressure. Your health care provider will tell you whether CPAP or BIPAP would be more helpful for you. CPAP stands for "continuous positive airway pressure." With CPAP, the amount of pressure stays the same while you breathe in (inhale) and out (exhale). BIPAP stands for "bi-level positive airway pressure." With BIPAP, the amount of pressure will be higher when you inhale and lower when you exhale. This allows you to take larger breaths. CPAP or BIPAP may be used in the hospital, or your health care provider may want you to use it at home. You may need to have a sleep study before your health care provider can order a machine for you to use at home. What are the advantages? CPAP or BIPAP can be helpful if you have: Sleep apnea. Chronic obstructive pulmonary disease (COPD). Heart failure. Medical conditions that cause muscle weakness, including muscular dystrophy or amyotrophic lateral sclerosis (ALS). Other problems that cause breathing to be shallow, weak, abnormal, or difficult. CPAP and BIPAP are most commonly used for obstructive sleep apnea (OSA) to keep the airways from collapsing when the muscles relax during sleep. What are the risks? Generally, this is a safe treatment. However, problems may occur, including: Irritated skin or skin sores if the mask does not fit properly. Dry or stuffy nose or nosebleeds. Dry  mouth. Feeling gassy or bloated. Sinus or lung infection if the equipment is not cleaned properly. When should CPAP or BIPAP be used? In most cases, the mask only needs to be worn during sleep. Generally, the mask needs to be worn throughout the night and during any daytime naps. People with certain medical conditions may also need to wear the mask at other times, such as when they are awake. Follow instructions from your health care provider about when to use the machine. What happens during CPAP or BIPAP?  Both CPAP and BIPAP are provided by a small machine with a flexible plastic tube that attaches to a plastic mask that you wear. Air is blown through the mask into your nose or mouth. The amount of pressure that is used to blow the air can be adjusted on the machine. Your health care provider will set the pressure setting and help you find the best mask for you. Tips for using the mask Because the mask needs to be snug, some people feel trapped or closed-in (claustrophobic) when first using the mask. If you feel this way, you may need to get used to the mask. One way to do this is to hold the mask loosely over your nose or mouth and then gradually apply the mask more snugly. You can also gradually increase the amount of time that you use the mask. Masks are available in various types and sizes. If your mask does not fit well, talk with your health care provider about getting a different one. Some common types of masks include: Full  face masks, which fit over the mouth and nose. Nasal masks, which fit over the nose. Nasal pillow or prong masks, which fit into the nostrils. If you are using a mask that fits over your nose and you tend to breathe through your mouth, a chin strap may be applied to help keep your mouth closed. Use a skin barrier to protect your skin as told by your health care provider. Some CPAP and BIPAP machines have alarms that may sound if the mask comes off or develops a  leak. If you have trouble with the mask, it is very important that you talk with your health care provider about finding a way to make the mask easier to tolerate. Do not stop using the mask. There could be a negative impact on your health if you stop using the mask. Tips for using the machine Place your CPAP or BIPAP machine on a secure table or stand near an electrical outlet. Know where the on/off switch is on the machine. Follow instructions from your health care provider about how to set the pressure on your machine and when you should use it. Do not eat or drink while the CPAP or BIPAP machine is on. Food or fluids could get pushed into your lungs by the pressure of the CPAP or BIPAP. For home use, CPAP and BIPAP machines can be rented or purchased through home health care companies. Many different brands of machines are available. Renting a machine before purchasing may help you find out which particular machine works well for you. Your health insurance company may also decide which machine you may get. Keep the CPAP or BIPAP machine and attachments clean. Ask your health care provider for specific instructions. Check the humidifier if you have a dry stuffy nose or nosebleeds. Make sure it is working correctly. Follow these instructions at home: Take over-the-counter and prescription medicines only as told by your health care provider. Ask if you can take sinus medicine if your sinuses are blocked. Do not use any products that contain nicotine or tobacco. These products include cigarettes, chewing tobacco, and vaping devices, such as e-cigarettes. If you need help quitting, ask your health care provider. Keep all follow-up visits. This is important. Contact a health care provider if: You have redness or pressure sores on your head, face, mouth, or nose from the mask or head gear. You have trouble using the CPAP or BIPAP machine. You cannot tolerate wearing the CPAP or BIPAP mask. Someone  tells you that you snore even when wearing your CPAP or BIPAP. Get help right away if: You have trouble breathing. You feel confused. Summary CPAP and BIPAP are methods that use air pressure to keep your airways open and to help you breathe well. If you have trouble with the mask, it is very important that you talk with your health care provider about finding a way to make the mask easier to tolerate. Do not stop using the mask. There could be a negative impact to your health if you stop using the mask. Follow instructions from your health care provider about when to use the machine. This information is not intended to replace advice given to you by your health care provider. Make sure you discuss any questions you have with your health care provider. Document Revised: 05/07/2021 Document Reviewed: 09/06/2020 Elsevier Patient Education  2023 [employer].

## 2023-07-30 NOTE — Progress Notes (Unsigned)
Virtual Visit via Video Note  I connected with Perry Huffman on 07/30/23 at  9:00 AM EDT by a video enabled telemedicine application and verified that I am speaking with the correct person using two identifiers.  Location: Patient: Home Provider: Office    I discussed the limitations of evaluation and management by telemedicine and the availability of in person appointments. The patient expressed understanding and agreed to proceed.  History of Present Illness: 47 year old male. Never smoked. PMH significant for anxiety disorder, hyperlipidemia, obesity.    Previous [facility] pulmonary encounter:  06/21/2023 Patient presents today for sleep consult.  Patient reports nonrestorative.  He feels sleepy after waking up in the morning despite getting 6 or 7 hours of sleep. He is tired throughout the day. He has been told by his wife that he snores loudly.  He has woken himself up gasping or choking at night.  Typical bedtime is between 10 and 11 PM. He wakes up a couple of times a night. He starts his day at 5/5:30am. He is actively working on weight loss. Reports better sleep when he weighed less. No previous sleep studies. He does not operate heavy machinery. No concern for narcolepsy, cataplexy or sleep walking. Epworth 19/24.    Sleep questionnaire Symptoms-  loud snoring, waking up gasping/choking, restless sleep and daytime sleepiness Prior sleep study-  Bedtime- 10-11pm Time to fall asleep- 10-30 mins Nocturnal awakenings- several  Out of bed/start of day- 5-5:30am  Weight changes- none Do you operate heavy machinery- no Do you currently wear CPAP- no Do you current wear oxygen- no Epworth- 19     07/30/2023- Interim hx  Patient contacted today to review sleep study results. Patient had a home sleep study on 07/01/2023 which showed severe obstructive sleep apnea, AHI 40.4 an hour with SpO2 low 81% (average 97%).  Patient spent 23 minutes with oxygen level less than 88%.  Reviewed sleep study  results, risks of untreated sleep apnea and treatment options.   Observations/Objective: Appears well without overt respiratory symptoms  Assessment and Plan:  Severe obstructive sleep apnea - HST 07/01/2023 showed severe OSA, AHI 40.4 an hour with SpO2 low 81% (average 97%.) - Patient spent 23 minutes with oxygen level less than 88%. - Reviewed risks of untreated sleep apnea and treatment options - Starting auto CPAP 5 to 20 cm H2O.  Advised patient aim to wear 4 to 6 hours or longer per night  Follow Up Instructions:  8 weeks CPAP compliance with Waynetta Sandy NP   I discussed the assessment and treatment plan with the patient. The patient was provided an opportunity to ask questions and all were answered. The patient agreed with the plan and demonstrated an understanding of the instructions.   The patient was advised to call back or seek an in-person evaluation if the symptoms worsen or if the condition fails to improve as anticipated.  I provided 22 minutes of non-face-to-face time during this encounter.   Glenford Bayley, NP

## 2023-08-20 ENCOUNTER — Telehealth: Payer: Managed Care, Other (non HMO) | Admitting: Primary Care

## 2023-09-26 IMAGING — DX DG CHEST 2V
2 series · 2 of 2 positions shown · non-contrast
Comparison: Chest x-ray 12/03/2008.

CLINICAL DATA: 46-year-old male with history of cough and chest
congestion.

EXAM:
CHEST - 2 VIEW

[chest pa]
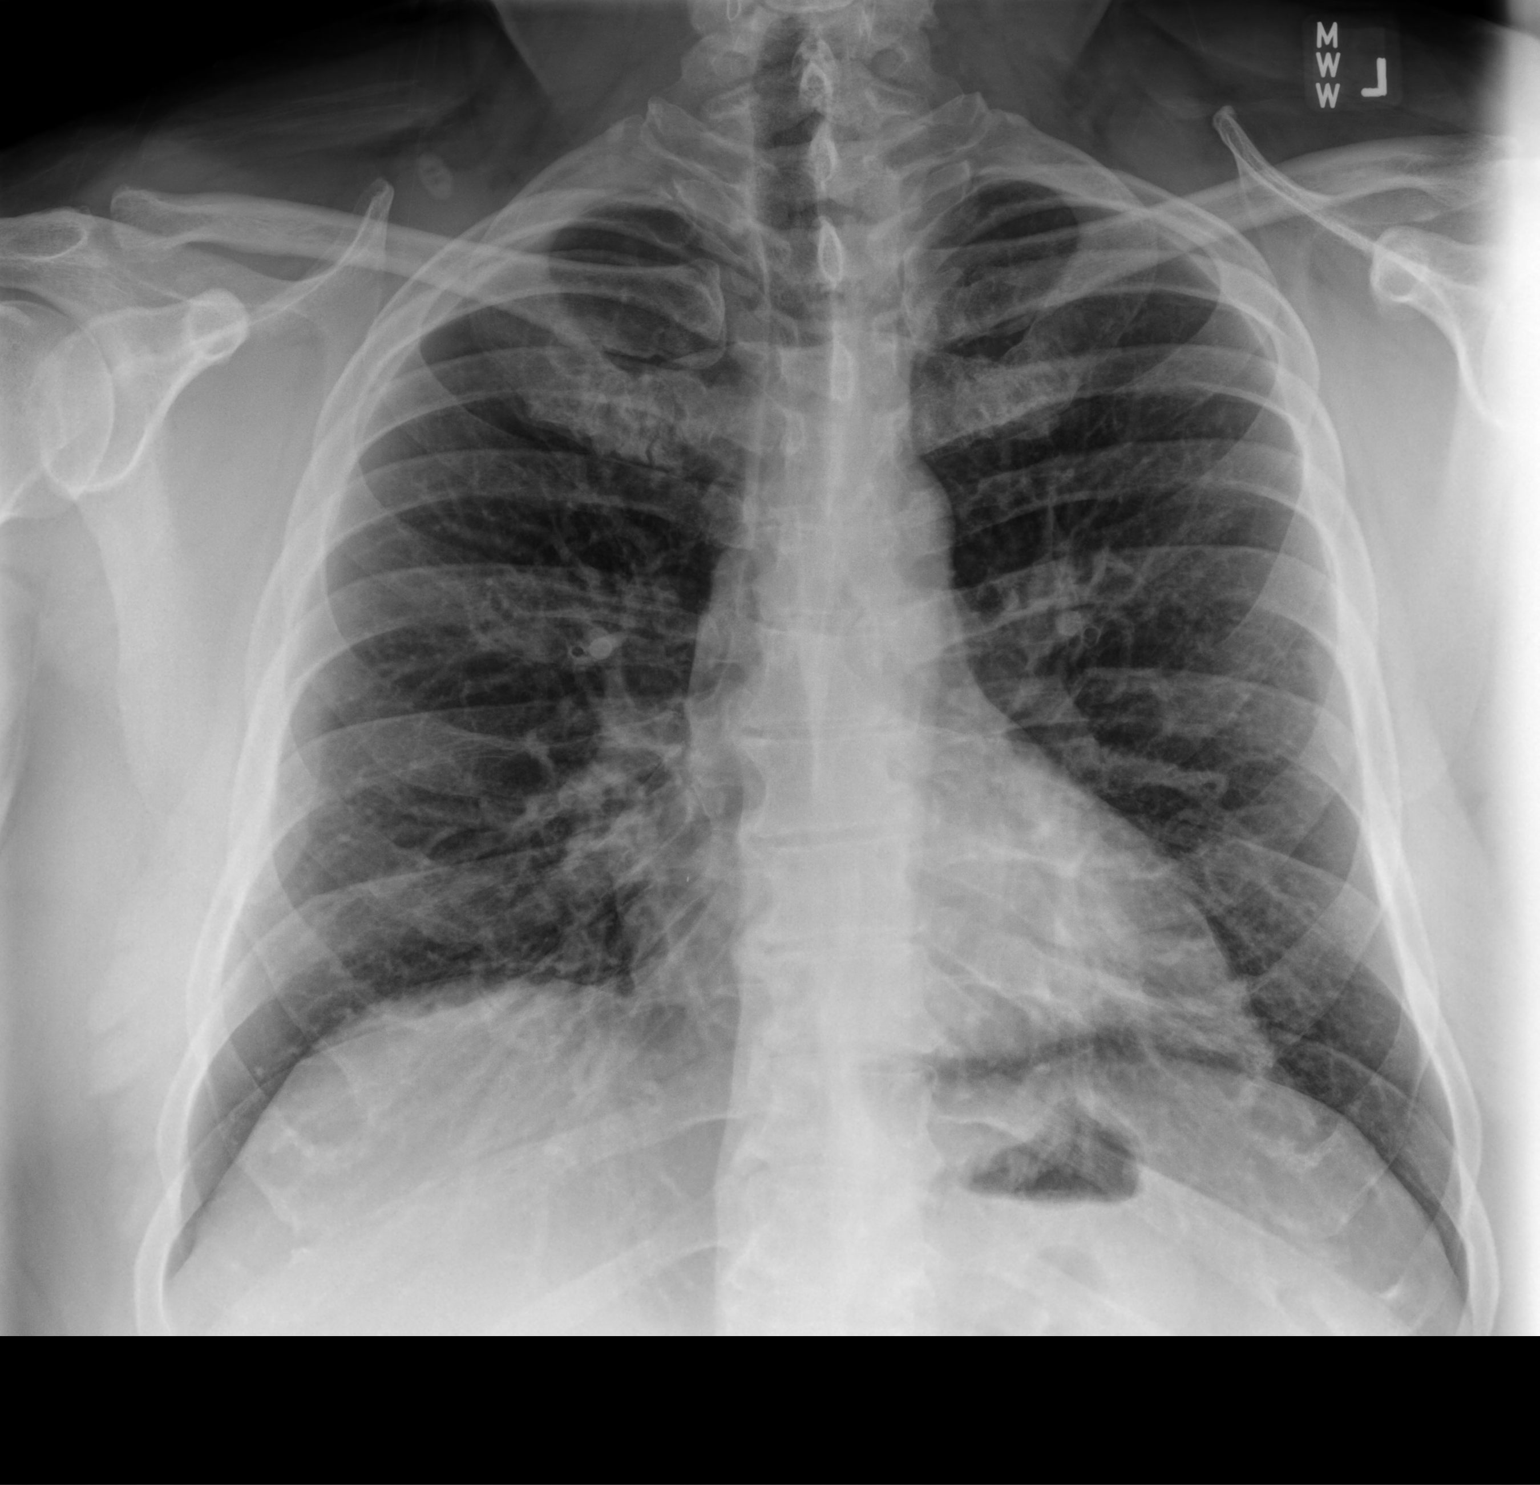

[chest lat]
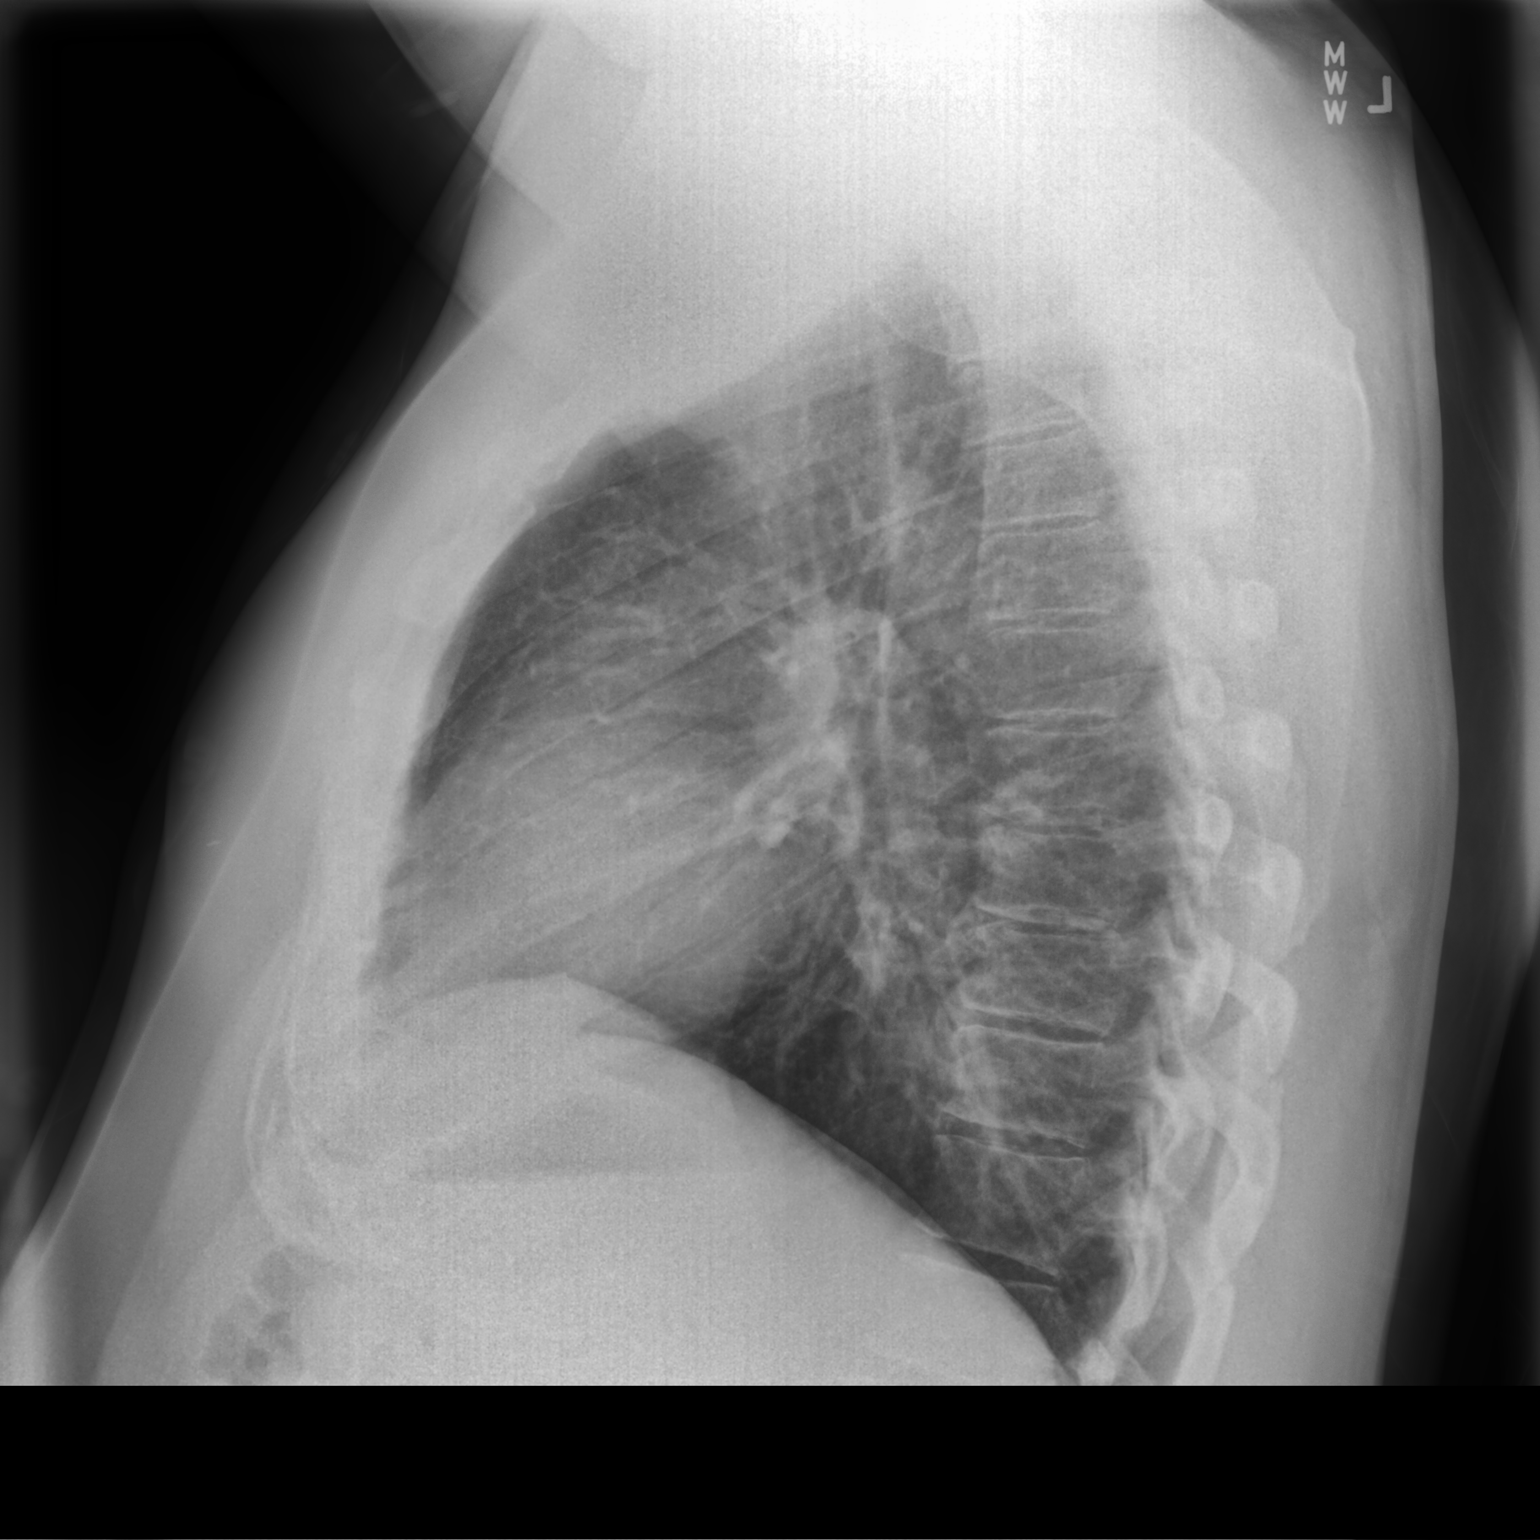

[2 of 2 positions shown; findings below may reference images not displayed]

FINDINGS: Lung volumes are normal. No consolidative airspace disease. No
pleural effusions. No pneumothorax. No pulmonary nodule or mass
noted. Pulmonary vasculature and the cardiomediastinal silhouette
are within normal limits.
IMPRESSION: No radiographic evidence of acute cardiopulmonary disease.

## 2023-09-27 ENCOUNTER — Telehealth: Payer: Managed Care, Other (non HMO) | Admitting: Primary Care

## 2023-09-27 ENCOUNTER — Telehealth: Payer: Self-pay

## 2023-09-27 ENCOUNTER — Encounter: Payer: Self-pay | Admitting: Primary Care

## 2023-09-27 DIAGNOSIS — G4733 Obstructive sleep apnea (adult) (pediatric): Secondary | ICD-10-CM

## 2023-09-27 NOTE — Progress Notes (Signed)
Virtual Visit via Video Note  I connected with Perry Huffman on 09/27/23 at 10:30 AM EST by a video enabled telemedicine application and verified that I am speaking with the correct person using two identifiers.  Location: Patient: Home Provider: Office    I discussed the limitations of evaluation and management by telemedicine and the availability of in person appointments. The patient expressed understanding and agreed to proceed.  History of Present Illness: 47 year old male. Never smoked. PMH significant for anxiety disorder, hyperlipidemia, obesity.    Previous [facility] encounter:  06/21/2023 Patient presents today for sleep consult.  Patient reports nonrestorative.  He feels sleepy after waking up in the morning despite getting 6 or 7 hours of sleep. He is tired throughout the day. He has been told by his wife that he snores loudly.  He has woken himself up gasping or choking at night.  Typical bedtime is between 10 and 11 PM. He wakes up a couple of times a night. He starts his day at 5/5:30am. He is actively working on weight loss. Reports better sleep when he weighed less. No previous sleep studies. He does not operate heavy machinery. No concern for narcolepsy, cataplexy or sleep walking. Epworth 19/24.    Sleep questionnaire Symptoms-  loud snoring, waking up gasping/choking, restless sleep and daytime sleepiness Prior sleep study- no Bedtime- 10-11pm Time to fall asleep- 10-30 mins Nocturnal awakenings- several  Out of bed/start of day- 5-5:30am  Weight changes- none Do you operate heavy machinery- no Do you currently wear CPAP- no Do you current wear oxygen- no Epworth- 19     07/30/2023 Patient contacted today to review sleep study results. Patient had a home sleep study on 07/01/2023 which showed severe obstructive sleep apnea, AHI 40.4 an hour with SpO2 low 81% (average 97%).  Patient spent 23 minutes with oxygen level less than 88%.  Reviewed sleep study results,  risks of untreated sleep apnea and treatment options.  Severe obstructive sleep apnea - HST 07/01/2023 showed severe OSA, AHI 40.4 an hour with SpO2 low 81% (average 97%.) - Patient spent 23 minutes with oxygen level less than 88%. - Reviewed risks of untreated sleep apnea and treatment options - Starting auto CPAP 5 to 20 cm H2O.  Advised patient aim to wear 4 to 6 hours or longer per night  09/27/2023- Interim hx  Discussed the use of AI scribe software for clinical note transcription with the patient, who gave verbal consent to proceed.  Patient contacted today for 2 month follow-up. Hx severe sleep apnea, AHI 40/hour. Started on auto CPAP in October 2024.  Patient is 57% compliant with use over the last 30 days.  Average usage days used 3 hours 12 minutes.  Current pressure 5 to 28 cm H2O; Residual AHI 2.4/hour.  He is having a moderate to large amount of air leaks.  He reports difficulty adjusting to the CPAP initially, resulting in inconsistent usage. However, he notes an improvement in sleep quality on nights when the CPAP is used. The patient has been using a full face mask but expresses interest in trying a nasal mask. He also reports experiencing air leaks and discomfort due to high pressure. Despite these challenges, the patient has seen a significant reduction in apneic events per hour when using the CPAP.     Airview download 08/25/2023 - 09/23/2023 Usage days 17/30 days (57%); 8 days (27%) greater than 4 hours Average usage days used 3 hours 12 minutes Pressure 5 to  20 cm H2O (10.2 cm H2O-95%) Air leaks 34.8 L/min (95%) AHI 2.4  Observations/Objective:  Appears well, no overt shortness of breath or wheezing  Assessment and Plan:  Severe obstructive sleep apnea: - HST 07/01/2023 showed severe OSA, AHI 40.4 an hour with SpO2 low 81% (average 97%). Started on auto CPAP 5-20cm h20 back in October. Patient reports improved sleep quality with CPAP use, but has been inconsistent in  usage due to difficulty adjusting to the device. Noted air leaks with current full face mask and high pressure settings. CPAP download shows significant reduction in apneic events when device is used. -Continue CPAP therapy, aiming for nightly use 4-6 hours or longer  -Lower maximum pressure setting from 20 to 15cm h20 to improve tolerability. -Order nasal mask for patient to try as an alternative to full face mask. -Advise patient on proper cleaning and replacement of CPAP supplies. -Check CPAP download in 3 months or sooner if patient reports issues.  Follow Up Instructions:  3 months with Beth NP or sooner if needed   I discussed the assessment and treatment plan with the patient. The patient was provided an opportunity to ask questions and all were answered. The patient agreed with the plan and demonstrated an understanding of the instructions.   The patient was advised to call back or seek an in-person evaluation if the symptoms worsen or if the condition fails to improve as anticipated.  I provided 22 minutes of non-face-to-face time during this encounter.   Glenford Bayley, NP

## 2023-09-27 NOTE — Patient Instructions (Signed)
Lower max pressure settings Mask fitting for nasal mask FU in 3 months with Waynetta Sandy NP

## 2023-11-04 NOTE — Telephone Encounter (Signed)
nfn

## 2023-12-29 ENCOUNTER — Telehealth: Payer: Managed Care, Other (non HMO) | Admitting: Primary Care

## 2023-12-29 DIAGNOSIS — G4733 Obstructive sleep apnea (adult) (pediatric): Secondary | ICD-10-CM

## 2023-12-29 NOTE — Progress Notes (Signed)
 Virtual Visit via Video Note  I connected with Perry Huffman on 12/29/23 at  8:30 AM EDT by a video enabled telemedicine application and verified that I am speaking with the correct person using two identifiers.  Location: Patient: Home Provider: [place] office   I discussed the limitations of evaluation and management by telemedicine and the availability of in person appointments. The patient expressed understanding and agreed to proceed.  History of Present Illness:  48 year old male. Never smoked. PMH significant for anxiety disorder, hyperlipidemia, obesity.    Previous [facility] encounter:  06/21/2023 Patient presents today for sleep consult.  Patient reports nonrestorative.  He feels sleepy after waking up in the morning despite getting 6 or 7 hours of sleep. He is tired throughout the day. He has been told by his wife that he snores loudly.  He has woken himself up gasping or choking at night.  Typical bedtime is between 10 and 11 PM. He wakes up a couple of times a night. He starts his day at 5/5:30am. He is actively working on weight loss. Reports better sleep when he weighed less. No previous sleep studies. He does not operate heavy machinery. No concern for narcolepsy, cataplexy or sleep walking. Epworth 19/24.    Sleep questionnaire Symptoms-  loud snoring, waking up gasping/choking, restless sleep and daytime sleepiness Prior sleep study- no Bedtime- 10-11pm Time to fall asleep- 10-30 mins Nocturnal awakenings- several  Out of bed/start of day- 5-5:30am  Weight changes- none Do you operate heavy machinery- no Do you currently wear CPAP- no Do you current wear oxygen- no Epworth- 19     07/30/2023 Patient contacted today to review sleep study results. Patient had a home sleep study on 07/01/2023 which showed severe obstructive sleep apnea, AHI 40.4 an hour with SpO2 low 81% (average 97%).  Patient spent 23 minutes with oxygen level less than 88%.  Reviewed sleep study  results, risks of untreated sleep apnea and treatment options.  Severe obstructive sleep apnea - HST 07/01/2023 showed severe OSA, AHI 40.4 an hour with SpO2 low 81% (average 97%.) - Patient spent 23 minutes with oxygen level less than 88%. - Reviewed risks of untreated sleep apnea and treatment options - Starting auto CPAP 5 to 20 cm H2O.  Advised patient aim to wear 4 to 6 hours or longer per night  09/27/2023- Interim hx  Discussed the use of AI scribe software for clinical note transcription with the patient, who gave verbal consent to proceed.  Patient contacted today for 2 month follow-up. Hx severe sleep apnea, AHI 40/hour. Started on auto CPAP in October 2024.  Patient is 57% compliant with use over the last 30 days.  Average usage days used 3 hours 12 minutes.  Current pressure 5 to 28 cm H2O; Residual AHI 2.4/hour.  He is having a moderate to large amount of air leaks.  He reports difficulty adjusting to the CPAP initially, resulting in inconsistent usage. However, he notes an improvement in sleep quality on nights when the CPAP is used. The patient has been using a full face mask but expresses interest in trying a nasal mask. He also reports experiencing air leaks and discomfort due to high pressure. Despite these challenges, the patient has seen a significant reduction in apneic events per hour when using the CPAP.     Airview download 08/25/2023 - 09/23/2023 Usage days 17/30 days (57%); 8 days (27%) greater than 4 hours Average usage days used 3 hours 12 minutes Pressure  5 to 20 cm H2O (10.2 cm H2O-95%) Air leaks 34.8 L/min (95%) AHI 2.4  12/29/2023 Discussed the use of AI scribe software for clinical note transcription with the patient, who gave verbal consent to proceed.  History of Present Illness   Perry Huffman is a 48 year old male with severe obstructive sleep apnea who presents for follow-up of CPAP therapy.  Initially diagnosed with severe obstructive sleep apnea in  September 2024, with a sleep study showing an average of 40 apneic events per hour. CPAP therapy was initiated following the study results.  During a follow-up virtual visit in December 2024, he experienced difficulty with compliance and adjusting to the CPAP, noting air leaks and discomfort due to pressure. Despite these issues, improved sleep quality on nights when the CPAP was used.  Currently, he uses the CPAP 80% of the time over the last 30 days, averaging 4 hours and 20 minutes per night. The CPAP is set to auto settings with a minimum pressure of 5 and a maximum of 15, typically using a pressure between 6 to 9. His apnea score has improved to 1.6 events per hour when using the CPAP, compared to 40 during the initial sleep study.  He reports more comfort with the CPAP now, although usage was reduced last week due to sinus issues. He uses a full face mask and his wife notes he does not snore when using the CPAP. He typically sleeps 5 to 6 hours per night and feels less tired and sluggish during the day.   Airview download 11/29/23-12/28/23 Usage days 24/30 days (80%) Average usage days used 4 hours 20 minutes Pressure 5 to 15 cm H2O Air leaks median 23.8 L/min; 95th percentile 47.3 L/min  AHI 1.6     Observations/Objective:  Appears well without overt respiratory symptoms  Assessment and Plan:  1. OSA (obstructive sleep apnea) (Primary)     Obstructive Sleep Apnea Severe obstructive sleep apnea with initial CPAP compliance issues. During our last visit we lowered max pressure settings to 15cm h20. Current settings 5-15cm h20; residual apnea 1.6 events per hour. Improved comfort and reduced snoring noted. Discussed CPAP importance and cardiovascular risk prevention. Weight loss discussed as a potential method to reduce severity. - Continue CPAP therapy nightly. - Schedule follow-up in 6 months, then annually if well-controlled. - Encourage weight loss to potentially reduce sleep apnea  severity. - Replace CPAP supplies regularly: cushions monthly, tubing every 3 months, water chamber every 6 months, filters monthly, headgear every 6 months.      Follow Up Instructions:  6 months or sooner if needed    I discussed the assessment and treatment plan with the patient. The patient was provided an opportunity to ask questions and all were answered. The patient agreed with the plan and demonstrated an understanding of the instructions.   The patient was advised to call back or seek an in-person evaluation if the symptoms worsen or if the condition fails to improve as anticipated.  I provided 20 minutes of non-face-to-face time during this encounter.   Glenford Bayley, NP

## 2024-05-09 ENCOUNTER — Ambulatory Visit: Admitting: Family Medicine

## 2024-05-09 VITALS — BP 120/77 | HR 63 | Temp 97.5°F | Ht 69.0 in | Wt 281.6 lb

## 2024-05-09 DIAGNOSIS — R7303 Prediabetes: Secondary | ICD-10-CM

## 2024-05-09 DIAGNOSIS — Z130 Encounter for screening for diseases of the blood and blood-forming organs and certain disorders involving the immune mechanism: Secondary | ICD-10-CM | POA: Diagnosis not present

## 2024-05-09 DIAGNOSIS — E785 Hyperlipidemia, unspecified: Secondary | ICD-10-CM

## 2024-05-09 DIAGNOSIS — F411 Generalized anxiety disorder: Secondary | ICD-10-CM | POA: Diagnosis not present

## 2024-05-09 MED ORDER — ESCITALOPRAM OXALATE 10 MG PO TABS: ORAL_TABLET | ORAL | 3 refills | Status: AC

## 2024-05-09 MED ORDER — ROSUVASTATIN CALCIUM 10 MG PO TABS
10.0000 mg | ORAL_TABLET | Freq: Every day | ORAL | 3 refills | Status: DC
Start: 2024-05-09 — End: 2024-05-29

## 2024-05-09 NOTE — Assessment & Plan Note (Signed)
 Last lipid panel was not at goal.  Lipid panel ordered.  Continue Crestor .  Will adjust dose if needed.  Refill sent in.

## 2024-05-09 NOTE — Progress Notes (Signed)
 Subjective:  Patient ID: Perry Huffman, male    DOB: 1976/08/20  Age: 48 y.o. MRN: [id number]  CC: Follow-up   HPI:  48 year old male presents for follow-up.  Patient reports that overall he is doing well.  Patient states that he is interested in taking supplements in regards to working out.  He wants to make sure that these will not interfere with his medications.  His blood pressure is well-controlled.  Patient needs refills on his cholesterol medication and his medication for anxiety.  He is stable at this time.  Needs labs.  Patient Active Problem List   Diagnosis Date Noted   Prediabetes 05/09/2024   Severe sleep apnea 05/18/2023   Morbid obesity (HCC) 11/13/2021   Kidney stones 03/13/2020   Generalized anxiety disorder 12/03/2013   Hyperlipidemia 01/10/2013    Social Hx   Social History   Socioeconomic History   Marital status: Married    Spouse name: Not on file   Number of children: 2   Years of education: Not on file   Highest education level: 12th grade  Occupational History   Not on file  Tobacco Use   Smoking status: Never   Smokeless tobacco: Never  Substance and Sexual Activity   Alcohol use: Yes    Comment: occ.    Drug use: Never   Sexual activity: Not on file  Other Topics Concern   Not on file  Social History Narrative   Not on file   Social Drivers of Health   Financial Resource Strain: Low Risk  (05/05/2024)   Overall Financial Resource Strain (CARDIA)    Difficulty of Paying Living Expenses: Not hard at all  Food Insecurity: No Food Insecurity (05/05/2024)   Hunger Vital Sign    Worried About Running Out of Food in the Last Year: Never true    Ran Out of Food in the Last Year: Never true  Transportation Needs: No Transportation Needs (05/05/2024)   PRAPARE - Administrator, Civil Service (Medical): No    Lack of Transportation (Non-Medical): No  Physical Activity: Insufficiently Active (05/05/2024)   Exercise Vital Sign     Days of Exercise per Week: 3 days    Minutes of Exercise per Session: 40 min  Stress: No Stress Concern Present (05/05/2024)   [employer] of Occupational Health - Occupational Stress Questionnaire    Feeling of Stress: Only a little  Social Connections: Moderately Integrated (05/05/2024)   Social Connection and Isolation Panel    Frequency of Communication with Friends and Family: Three times a week    Frequency of Social Gatherings with Friends and Family: More than three times a week    Attends Religious Services: 1 to 4 times per year    Active Member of [employer] or Organizations: No    Attends Engineer, structural: Not on file    Marital Status: Married    Review of Systems Per HPI  Objective:  BP 120/77   Pulse 63   Temp (!) 97.5 F (36.4 C)   Ht 5' 9 (1.753 m)   Wt 281 lb 9.6 oz (127.7 kg)   SpO2 95%   BMI 41.59 kg/m      05/09/2024   10:23 AM 06/21/2023   10:19 AM 05/17/2023    1:44 PM  BP/Weight  Systolic BP 120 118 152  Diastolic BP 77 70 94  Wt. (Lbs) 281.6 283.8 287.6  BMI 41.59 kg/m2 41.91 kg/m2 41.27 kg/m2  Physical Exam Vitals and nursing note reviewed.  Constitutional:      General: He is not in acute distress.    Appearance: Normal appearance. He is obese.  HENT:     Head: Normocephalic and atraumatic.  Eyes:     General:        Right eye: No discharge.        Left eye: No discharge.     Conjunctiva/sclera: Conjunctivae normal.  Cardiovascular:     Rate and Rhythm: Normal rate and regular rhythm.  Pulmonary:     Effort: Pulmonary effort is normal.     Breath sounds: Normal breath sounds. No wheezing, rhonchi or rales.  Neurological:     Mental Status: He is alert.  Psychiatric:        Mood and Affect: Mood normal.        Behavior: Behavior normal.     Lab Results  Component Value Date   WBC 7.0 05/20/2023   HGB 15.6 05/20/2023   HCT 46.0 05/20/2023   PLT 216 05/20/2023   GLUCOSE 96 05/20/2023   CHOL 182 05/20/2023    TRIG 156 (H) 05/20/2023   HDL 42 05/20/2023   LDLCALC 112 (H) 05/20/2023   ALT 41 05/20/2023   AST 30 05/20/2023   NA 140 05/20/2023   K 4.3 05/20/2023   CL 105 05/20/2023   CREATININE 0.98 05/20/2023   BUN 15 05/20/2023   CO2 21 05/20/2023   HGBA1C 5.7 (H) 05/20/2023     Assessment & Plan:  Hyperlipidemia, unspecified hyperlipidemia type Assessment & Plan: Last lipid panel was not at goal.  Lipid panel ordered.  Continue Crestor .  Will adjust dose if needed.  Refill sent in.  Orders: -     Lipid panel -     Rosuvastatin  Calcium ; Take 1 tablet (10 mg total) by mouth daily.  Dispense: 90 tablet; Refill: 3  Prediabetes -     CMP14+EGFR -     Hemoglobin A1c  Screening for deficiency anemia -     CBC  Generalized anxiety disorder Assessment & Plan: Stable on Lexapro .  Lexapro  sent in.  Orders: -     Escitalopram  Oxalate; TAKE 1 TABLET(10 MG) BY MOUTH DAILY  Dispense: 90 tablet; Refill: 3    Follow-up: 6 months to 1 year  Terah Robey DO [facility]

## 2024-05-09 NOTE — Assessment & Plan Note (Signed)
 Stable on Lexapro .  Lexapro  sent in.

## 2024-05-09 NOTE — Patient Instructions (Signed)
 Labs ordered.  Follow up in 6 months to 1 year.  Take care  Dr. Bluford

## 2024-05-13 LAB — CMP14+EGFR
ALT: 35 IU/L (ref 0–44)
AST: 25 IU/L (ref 0–40)
Albumin: 4.3 g/dL (ref 4.1–5.1)
Alkaline Phosphatase: 79 IU/L (ref 44–121)
BUN/Creatinine Ratio: 15 (ref 9–20)
BUN: 15 mg/dL (ref 6–24)
Bilirubin Total: 0.7 mg/dL (ref 0.0–1.2)
CO2: 21 mmol/L (ref 20–29)
Calcium: 9.2 mg/dL (ref 8.7–10.2)
Chloride: 103 mmol/L (ref 96–106)
Creatinine, Ser: 0.98 mg/dL (ref 0.76–1.27)
Globulin, Total: 2.2 g/dL (ref 1.5–4.5)
Glucose: 98 mg/dL (ref 70–99)
Potassium: 4.3 mmol/L (ref 3.5–5.2)
Sodium: 139 mmol/L (ref 134–144)
Total Protein: 6.5 g/dL (ref 6.0–8.5)
eGFR: 96 mL/min/1.73 (ref >59)

## 2024-05-13 LAB — LIPID PANEL
Chol/HDL Ratio: 4.1 ratio (ref 0.0–5.0)
Cholesterol, Total: 160 mg/dL (ref 100–199)
HDL: 39 mg/dL — ABNORMAL LOW (ref >39)
LDL Chol Calc (NIH): 94 mg/dL (ref 0–99)
Triglycerides: 153 mg/dL — ABNORMAL HIGH (ref 0–149)
VLDL Cholesterol Cal: 27 mg/dL (ref 5–40)

## 2024-05-13 LAB — CBC
Hematocrit: 46.6 % (ref 37.5–51.0)
Hemoglobin: 15.2 g/dL (ref 13.0–17.7)
MCH: 30.2 pg (ref 26.6–33.0)
MCHC: 32.6 g/dL (ref 31.5–35.7)
MCV: 93 fL (ref 79–97)
Platelets: 241 x10E3/uL (ref 150–450)
RBC: 5.03 x10E6/uL (ref 4.14–5.80)
RDW: 12.6 % (ref 11.6–15.4)
WBC: 6.2 x10E3/uL (ref 3.4–10.8)

## 2024-05-13 LAB — HEMOGLOBIN A1C
Est. average glucose Bld gHb Est-mCnc: 108 mg/dL
Hgb A1c MFr Bld: 5.4 % (ref 4.8–5.6)

## 2024-05-14 ENCOUNTER — Ambulatory Visit: Payer: Self-pay | Admitting: Family Medicine

## 2024-05-27 ENCOUNTER — Other Ambulatory Visit: Payer: Self-pay | Admitting: Family Medicine

## 2024-05-27 DIAGNOSIS — E785 Hyperlipidemia, unspecified: Secondary | ICD-10-CM

## 2024-05-29 ENCOUNTER — Other Ambulatory Visit: Payer: Self-pay

## 2024-05-29 DIAGNOSIS — E785 Hyperlipidemia, unspecified: Secondary | ICD-10-CM

## 2024-05-29 MED ORDER — ROSUVASTATIN CALCIUM 10 MG PO TABS
10.0000 mg | ORAL_TABLET | Freq: Every day | ORAL | 3 refills | Status: AC
Start: 2024-05-29 — End: ?
